# Patient Record
Sex: Female | Born: 1960 | Race: White | Hispanic: No | Smoking: Never smoker
Health system: Southern US, Community
[De-identification: ages and names within clinical notes are randomized; demographics above are authoritative.]

## PROBLEM LIST (undated history)

## (undated) DIAGNOSIS — M1990 Unspecified osteoarthritis, unspecified site: Secondary | ICD-10-CM

## (undated) DIAGNOSIS — E282 Polycystic ovarian syndrome: Secondary | ICD-10-CM

## (undated) DIAGNOSIS — J449 Chronic obstructive pulmonary disease, unspecified: Secondary | ICD-10-CM

## (undated) DIAGNOSIS — F419 Anxiety disorder, unspecified: Secondary | ICD-10-CM

## (undated) DIAGNOSIS — K219 Gastro-esophageal reflux disease without esophagitis: Secondary | ICD-10-CM

## (undated) DIAGNOSIS — I10 Essential (primary) hypertension: Secondary | ICD-10-CM

## (undated) HISTORY — PX: CERVICAL FUSION: SHX112

---

## 2000-11-05 ENCOUNTER — Emergency Department (HOSPITAL_COMMUNITY): Admission: EM | Admit: 2000-11-05 | Discharge: 2000-11-05 | Payer: Self-pay | Admitting: *Deleted

## 2001-07-08 ENCOUNTER — Emergency Department (HOSPITAL_COMMUNITY): Admission: EM | Admit: 2001-07-08 | Discharge: 2001-07-08 | Payer: Self-pay | Admitting: Emergency Medicine

## 2001-10-07 ENCOUNTER — Encounter: Payer: Self-pay | Admitting: Emergency Medicine

## 2001-10-07 ENCOUNTER — Emergency Department (HOSPITAL_COMMUNITY): Admission: EM | Admit: 2001-10-07 | Discharge: 2001-10-07 | Payer: Self-pay

## 2002-02-10 ENCOUNTER — Emergency Department (HOSPITAL_COMMUNITY): Admission: EM | Admit: 2002-02-10 | Discharge: 2002-02-10 | Payer: Self-pay | Admitting: Emergency Medicine

## 2003-03-29 ENCOUNTER — Emergency Department (HOSPITAL_COMMUNITY): Admission: EM | Admit: 2003-03-29 | Discharge: 2003-03-29 | Payer: Self-pay | Admitting: Emergency Medicine

## 2003-04-23 ENCOUNTER — Emergency Department (HOSPITAL_COMMUNITY): Admission: EM | Admit: 2003-04-23 | Discharge: 2003-04-23 | Payer: Self-pay | Admitting: Emergency Medicine

## 2003-05-02 ENCOUNTER — Inpatient Hospital Stay (HOSPITAL_COMMUNITY): Admission: EM | Admit: 2003-05-02 | Discharge: 2003-05-04 | Payer: Self-pay | Admitting: Internal Medicine

## 2003-12-26 ENCOUNTER — Emergency Department (HOSPITAL_COMMUNITY): Admission: EM | Admit: 2003-12-26 | Discharge: 2003-12-26 | Payer: Self-pay | Admitting: Emergency Medicine

## 2003-12-29 ENCOUNTER — Emergency Department (HOSPITAL_COMMUNITY): Admission: EM | Admit: 2003-12-29 | Discharge: 2003-12-29 | Payer: Self-pay | Admitting: Emergency Medicine

## 2004-02-06 ENCOUNTER — Emergency Department (HOSPITAL_COMMUNITY): Admission: EM | Admit: 2004-02-06 | Discharge: 2004-02-06 | Payer: Self-pay | Admitting: Emergency Medicine

## 2004-03-22 ENCOUNTER — Emergency Department (HOSPITAL_COMMUNITY): Admission: EM | Admit: 2004-03-22 | Discharge: 2004-03-22 | Payer: Self-pay | Admitting: Emergency Medicine

## 2004-06-12 ENCOUNTER — Emergency Department (HOSPITAL_COMMUNITY): Admission: EM | Admit: 2004-06-12 | Discharge: 2004-06-12 | Payer: Self-pay | Admitting: Emergency Medicine

## 2006-01-13 ENCOUNTER — Emergency Department (HOSPITAL_COMMUNITY): Admission: EM | Admit: 2006-01-13 | Discharge: 2006-01-14 | Payer: Self-pay | Admitting: Emergency Medicine

## 2006-06-21 ENCOUNTER — Emergency Department (HOSPITAL_COMMUNITY): Admission: EM | Admit: 2006-06-21 | Discharge: 2006-06-21 | Payer: Self-pay | Admitting: Emergency Medicine

## 2006-08-09 ENCOUNTER — Emergency Department (HOSPITAL_COMMUNITY): Admission: EM | Admit: 2006-08-09 | Discharge: 2006-08-09 | Payer: Self-pay | Admitting: Emergency Medicine

## 2006-09-07 ENCOUNTER — Emergency Department (HOSPITAL_COMMUNITY): Admission: EM | Admit: 2006-09-07 | Discharge: 2006-09-07 | Payer: Self-pay | Admitting: Emergency Medicine

## 2006-11-26 ENCOUNTER — Emergency Department (HOSPITAL_COMMUNITY): Admission: EM | Admit: 2006-11-26 | Discharge: 2006-11-26 | Payer: Self-pay | Admitting: Emergency Medicine

## 2007-02-27 ENCOUNTER — Emergency Department (HOSPITAL_COMMUNITY): Admission: EM | Admit: 2007-02-27 | Discharge: 2007-02-27 | Payer: Self-pay | Admitting: Emergency Medicine

## 2007-04-14 ENCOUNTER — Emergency Department (HOSPITAL_COMMUNITY): Admission: EM | Admit: 2007-04-14 | Discharge: 2007-04-14 | Payer: Self-pay | Admitting: Emergency Medicine

## 2007-04-21 ENCOUNTER — Emergency Department (HOSPITAL_COMMUNITY): Admission: EM | Admit: 2007-04-21 | Discharge: 2007-04-21 | Payer: Self-pay | Admitting: Emergency Medicine

## 2007-07-22 ENCOUNTER — Emergency Department (HOSPITAL_COMMUNITY): Admission: EM | Admit: 2007-07-22 | Discharge: 2007-07-22 | Payer: Self-pay | Admitting: Emergency Medicine

## 2007-09-10 ENCOUNTER — Emergency Department (HOSPITAL_COMMUNITY): Admission: EM | Admit: 2007-09-10 | Discharge: 2007-09-10 | Payer: Self-pay | Admitting: Emergency Medicine

## 2008-04-14 ENCOUNTER — Emergency Department (HOSPITAL_COMMUNITY): Admission: EM | Admit: 2008-04-14 | Discharge: 2008-04-14 | Payer: Self-pay | Admitting: Emergency Medicine

## 2008-05-13 ENCOUNTER — Emergency Department (HOSPITAL_COMMUNITY): Admission: EM | Admit: 2008-05-13 | Discharge: 2008-05-14 | Payer: Self-pay | Admitting: Emergency Medicine

## 2008-05-22 ENCOUNTER — Emergency Department (HOSPITAL_COMMUNITY): Admission: EM | Admit: 2008-05-22 | Discharge: 2008-05-22 | Payer: Self-pay | Admitting: Emergency Medicine

## 2008-09-05 ENCOUNTER — Emergency Department (HOSPITAL_COMMUNITY): Admission: EM | Admit: 2008-09-05 | Discharge: 2008-09-05 | Payer: Self-pay | Admitting: Emergency Medicine

## 2008-09-12 ENCOUNTER — Emergency Department (HOSPITAL_COMMUNITY): Admission: EM | Admit: 2008-09-12 | Discharge: 2008-09-12 | Payer: Self-pay | Admitting: Emergency Medicine

## 2009-02-19 ENCOUNTER — Emergency Department (HOSPITAL_COMMUNITY): Admission: EM | Admit: 2009-02-19 | Discharge: 2009-02-19 | Payer: Self-pay | Admitting: Emergency Medicine

## 2009-06-22 HISTORY — PX: BACK SURGERY: SHX140

## 2010-02-16 ENCOUNTER — Emergency Department (HOSPITAL_COMMUNITY): Admission: EM | Admit: 2010-02-16 | Discharge: 2010-02-16 | Payer: Self-pay | Admitting: Emergency Medicine

## 2010-02-18 ENCOUNTER — Emergency Department (HOSPITAL_COMMUNITY): Admission: EM | Admit: 2010-02-18 | Discharge: 2010-02-18 | Payer: Self-pay | Admitting: Emergency Medicine

## 2010-05-02 ENCOUNTER — Emergency Department (HOSPITAL_COMMUNITY): Admission: EM | Admit: 2010-05-02 | Discharge: 2010-05-02 | Payer: Self-pay | Admitting: Emergency Medicine

## 2010-07-12 ENCOUNTER — Emergency Department (HOSPITAL_COMMUNITY)
Admission: EM | Admit: 2010-07-12 | Discharge: 2010-07-12 | Payer: Self-pay | Source: Home / Self Care | Admitting: Emergency Medicine

## 2010-07-22 ENCOUNTER — Emergency Department (HOSPITAL_COMMUNITY)
Admission: EM | Admit: 2010-07-22 | Discharge: 2010-07-22 | Payer: Self-pay | Source: Home / Self Care | Admitting: Emergency Medicine

## 2010-07-22 LAB — CBC
HCT: 33.7 % — ABNORMAL LOW (ref 36.0–46.0)
Hemoglobin: 11.4 g/dL — ABNORMAL LOW (ref 12.0–15.0)
MCH: 26.6 pg (ref 26.0–34.0)
MCHC: 33.8 g/dL (ref 30.0–36.0)
MCV: 78.6 fL (ref 78.0–100.0)
Platelets: 373 10*3/uL (ref 150–400)
RBC: 4.29 MIL/uL (ref 3.87–5.11)
RDW: 12.8 % (ref 11.5–15.5)
WBC: 6.5 10*3/uL (ref 4.0–10.5)

## 2010-07-22 LAB — DIFFERENTIAL
Basophils Absolute: 0 10*3/uL (ref 0.0–0.1)
Basophils Relative: 1 % (ref 0–1)
Eosinophils Absolute: 0.1 10*3/uL (ref 0.0–0.7)
Eosinophils Relative: 1 % (ref 0–5)
Lymphocytes Relative: 34 % (ref 12–46)
Lymphs Abs: 2.2 10*3/uL (ref 0.7–4.0)
Monocytes Absolute: 0.3 10*3/uL (ref 0.1–1.0)
Monocytes Relative: 5 % (ref 3–12)
Neutro Abs: 3.9 10*3/uL (ref 1.7–7.7)
Neutrophils Relative %: 60 % (ref 43–77)

## 2010-07-22 LAB — BASIC METABOLIC PANEL
BUN: 8 mg/dL (ref 6–23)
CO2: 30 mEq/L (ref 19–32)
Calcium: 9 mg/dL (ref 8.4–10.5)
Chloride: 102 mEq/L (ref 96–112)
Creatinine, Ser: 0.74 mg/dL (ref 0.4–1.2)
GFR calc Af Amer: >60 mL/min (ref >60)
GFR calc non Af Amer: >60 mL/min (ref >60)
Glucose, Bld: 111 mg/dL — ABNORMAL HIGH (ref 70–99)
Potassium: 3.5 mEq/L (ref 3.5–5.1)
Sodium: 138 mEq/L (ref 135–145)

## 2010-09-04 LAB — DIFFERENTIAL
Basophils Absolute: 0 10*3/uL (ref 0.0–0.1)
Basophils Relative: 0 % (ref 0–1)
Eosinophils Absolute: 0.1 10*3/uL (ref 0.0–0.7)
Eosinophils Relative: 1 % (ref 0–5)
Lymphocytes Relative: 18 % (ref 12–46)
Lymphs Abs: 1.5 10*3/uL (ref 0.7–4.0)
Monocytes Absolute: 0.5 10*3/uL (ref 0.1–1.0)
Monocytes Relative: 6 % (ref 3–12)
Neutro Abs: 6.3 10*3/uL (ref 1.7–7.7)
Neutrophils Relative %: 75 % (ref 43–77)

## 2010-09-04 LAB — LIPASE, BLOOD: Lipase: 32 U/L (ref 11–59)

## 2010-09-04 LAB — COMPREHENSIVE METABOLIC PANEL
ALT: 18 U/L (ref 0–35)
AST: 16 U/L (ref 0–37)
Albumin: 3.9 g/dL (ref 3.5–5.2)
Alkaline Phosphatase: 44 U/L (ref 39–117)
BUN: 11 mg/dL (ref 6–23)
CO2: 27 mEq/L (ref 19–32)
Calcium: 8.5 mg/dL (ref 8.4–10.5)
Chloride: 102 mEq/L (ref 96–112)
Creatinine, Ser: 0.85 mg/dL (ref 0.4–1.2)
GFR calc Af Amer: >60 mL/min (ref >60)
GFR calc non Af Amer: >60 mL/min (ref >60)
Glucose, Bld: 119 mg/dL — ABNORMAL HIGH (ref 70–99)
Potassium: 3.5 mEq/L (ref 3.5–5.1)
Sodium: 135 mEq/L (ref 135–145)
Total Bilirubin: 0.3 mg/dL (ref 0.3–1.2)
Total Protein: 6.9 g/dL (ref 6.0–8.3)

## 2010-09-04 LAB — URINALYSIS, ROUTINE W REFLEX MICROSCOPIC
Bilirubin Urine: NEGATIVE
Glucose, UA: NEGATIVE mg/dL
Leukocytes, UA: NEGATIVE
Nitrite: NEGATIVE
Protein, ur: NEGATIVE mg/dL
Specific Gravity, Urine: 1.02 (ref 1.005–1.030)
Urobilinogen, UA: 0.2 mg/dL (ref 0.0–1.0)
pH: 6 (ref 5.0–8.0)

## 2010-09-04 LAB — CBC
HCT: 34.9 % — ABNORMAL LOW (ref 36.0–46.0)
Hemoglobin: 11.2 g/dL — ABNORMAL LOW (ref 12.0–15.0)
MCH: 26.6 pg (ref 26.0–34.0)
MCHC: 32.1 g/dL (ref 30.0–36.0)
MCV: 82.9 fL (ref 78.0–100.0)
Platelets: 317 10*3/uL (ref 150–400)
RBC: 4.21 MIL/uL (ref 3.87–5.11)
RDW: 13.5 % (ref 11.5–15.5)
WBC: 8.5 10*3/uL (ref 4.0–10.5)

## 2010-09-04 LAB — POCT I-STAT, CHEM 8
BUN: 9 mg/dL (ref 6–23)
Calcium, Ion: 1.25 mmol/L (ref 1.12–1.32)
Chloride: 102 mEq/L (ref 96–112)
Creatinine, Ser: 0.6 mg/dL (ref 0.4–1.2)
Glucose, Bld: 112 mg/dL — ABNORMAL HIGH (ref 70–99)
HCT: 36 % (ref 36.0–46.0)
Hemoglobin: 12.2 g/dL (ref 12.0–15.0)
Potassium: 3.4 mEq/L — ABNORMAL LOW (ref 3.5–5.1)
Sodium: 139 mEq/L (ref 135–145)
TCO2: 27 mmol/L (ref 0–100)

## 2010-09-04 LAB — URINE MICROSCOPIC-ADD ON

## 2010-09-04 LAB — POCT PREGNANCY, URINE: Preg Test, Ur: NEGATIVE

## 2010-09-04 LAB — GLUCOSE, CAPILLARY: Glucose-Capillary: 121 mg/dL — ABNORMAL HIGH (ref 70–99)

## 2010-09-27 LAB — BASIC METABOLIC PANEL
BUN: 6 mg/dL (ref 6–23)
CO2: 30 mEq/L (ref 19–32)
Calcium: 9.3 mg/dL (ref 8.4–10.5)
Chloride: 98 mEq/L (ref 96–112)
Creatinine, Ser: 0.78 mg/dL (ref 0.4–1.2)
GFR calc Af Amer: >60 mL/min (ref >60)
GFR calc non Af Amer: >60 mL/min (ref >60)
Glucose, Bld: 101 mg/dL — ABNORMAL HIGH (ref 70–99)
Potassium: 3.5 mEq/L (ref 3.5–5.1)
Sodium: 135 mEq/L (ref 135–145)

## 2010-09-27 LAB — CBC
HCT: 34.8 % — ABNORMAL LOW (ref 36.0–46.0)
Hemoglobin: 11.9 g/dL — ABNORMAL LOW (ref 12.0–15.0)
MCHC: 34.1 g/dL (ref 30.0–36.0)
MCV: 81.1 fL (ref 78.0–100.0)
Platelets: 348 10*3/uL (ref 150–400)
RBC: 4.3 MIL/uL (ref 3.87–5.11)
RDW: 14.3 % (ref 11.5–15.5)
WBC: 8.2 10*3/uL (ref 4.0–10.5)

## 2010-09-27 LAB — DIFFERENTIAL
Basophils Absolute: 0 10*3/uL (ref 0.0–0.1)
Basophils Relative: 0 % (ref 0–1)
Eosinophils Absolute: 0.1 10*3/uL (ref 0.0–0.7)
Eosinophils Relative: 1 % (ref 0–5)
Lymphocytes Relative: 20 % (ref 12–46)
Lymphs Abs: 1.6 10*3/uL (ref 0.7–4.0)
Monocytes Absolute: 0.5 10*3/uL (ref 0.1–1.0)
Monocytes Relative: 6 % (ref 3–12)
Neutro Abs: 6 10*3/uL (ref 1.7–7.7)
Neutrophils Relative %: 73 % (ref 43–77)

## 2010-10-02 LAB — CBC
Platelets: 337 10*3/uL (ref 150–400)
RBC: 4.54 MIL/uL (ref 3.87–5.11)
WBC: 9 10*3/uL (ref 4.0–10.5)

## 2010-10-02 LAB — HEPATIC FUNCTION PANEL
ALT: 39 U/L — ABNORMAL HIGH (ref 0–35)
AST: 28 U/L (ref 0–37)
Albumin: 4 g/dL (ref 3.5–5.2)
Alkaline Phosphatase: 58 U/L (ref 39–117)
Total Protein: 7.9 g/dL (ref 6.0–8.3)

## 2010-10-02 LAB — DIFFERENTIAL
Eosinophils Absolute: 0.1 10*3/uL (ref 0.0–0.7)
Lymphocytes Relative: 22 % (ref 12–46)
Lymphs Abs: 2 10*3/uL (ref 0.7–4.0)
Neutro Abs: 6.3 10*3/uL (ref 1.7–7.7)
Neutrophils Relative %: 70 % (ref 43–77)

## 2010-10-02 LAB — BASIC METABOLIC PANEL
BUN: 11 mg/dL (ref 6–23)
BUN: 9 mg/dL (ref 6–23)
CO2: 29 mEq/L (ref 19–32)
Calcium: 8.9 mg/dL (ref 8.4–10.5)
Chloride: 98 mEq/L (ref 96–112)
Creatinine, Ser: 0.72 mg/dL (ref 0.4–1.2)
Creatinine, Ser: 0.77 mg/dL (ref 0.4–1.2)
GFR calc Af Amer: >60 mL/min (ref >60)
GFR calc Af Amer: >60 mL/min (ref >60)
GFR calc non Af Amer: >60 mL/min (ref >60)
Potassium: 3.5 mEq/L (ref 3.5–5.1)

## 2010-10-02 LAB — URINALYSIS, ROUTINE W REFLEX MICROSCOPIC
Glucose, UA: NEGATIVE mg/dL
Leukocytes, UA: NEGATIVE
Nitrite: NEGATIVE
Specific Gravity, Urine: <1.005 — ABNORMAL LOW (ref 1.005–1.030)
pH: 5.5 (ref 5.0–8.0)

## 2010-10-02 LAB — LIPASE, BLOOD: Lipase: 28 U/L (ref 11–59)

## 2010-10-02 LAB — URINE MICROSCOPIC-ADD ON

## 2010-10-02 LAB — GLUCOSE, CAPILLARY: Glucose-Capillary: 124 mg/dL — ABNORMAL HIGH (ref 70–99)

## 2010-11-07 NOTE — Discharge Summary (Signed)
NAME:  [name], [name]                        ACCOUNT NO.:  [id number]   MEDICAL RECORD NO.:  [id number]                   PATIENT TYPE:  INP   LOCATION:  A302                                 FACILITY:  [facility]   PHYSICIAN:  Vania Rea, M.D.              DATE OF BIRTH:  04-21-1961   DATE OF ADMISSION:  05/02/2003  DATE OF DISCHARGE:                                 DISCHARGE SUMMARY   PRIMARY MEDICAL CARE:  [facility] in [place].   DISCHARGE DIAGNOSES:  1. Lobar pneumonia, resolved.  2. Hypertension, controlled.  3. History of polycystic ovarian disease, stable.  4. Asthma, controlled.  5. Sinusitis.   DISCHARGE CONDITION:  Stable.   DISPOSITION:  To home.   DISCHARGE MEDICATIONS:  1. Atrovent and albuterol inhaler q.4h. when necessary.  2. Levaquin 500 mg daily for 14 days.  3. Robitussin 10 mL three times per day.  4. Flonase nasal spray one spray in both nostrils at bedtime.  5. Sudafed 30 mg three times daily for five days.  6. Lisinopril 20 mg daily.   The patient has been advised to discontinue hydrochlorothiazide.   HOSPITAL COURSE:  Please refer to History and Physical of May 02, 2003.  The patient complained of a dry cough for four days, was seen at an outside  hospital, received a Z-Pak and sent home but continued to get worse, was  phoned and told she had a pneumonia, eventually came to [place].  When we x-rayed  her she had right lower lobe pneumonia and was looking ill.  We admitted her  for observation and to start IV antibiotics.  However, the following morning  her ABGs suggested a PO2 of 68 which we thought she was inappropriately  hypoxic.  She was still having a lot of crepitations on the left side of  chest.  We added chest physiotherapy to her regimen as well as adding  Zithromax to her Levaquin.  Today she is looking much better.  When we  examine her chest there is a minimal amount of crepitation in the right base  posteriorly.   However, she is now complaining of a postnasal drip which is  causing her to cough.  We are discharging her with a 14-day course of  Levaquin which should completely resolve her pneumonia, and we are also  giving her Flonase and Sudafed for the sinusitis despite her history of high  blood pressure.  Because we do not want her to become dehydrated with a  pneumonia we are switching the hydrochlorothiazide to lisinopril.  Lisinopril will also assist in the management of her metabolic syndrome  which she probably has; this can be further investigated as an outpatient.  Hydrochlorothiazide also will tend to increase her blood sugar in this  patient who is prone to diabetes.   FOLLOW-UP:  At the free clinic in [place].   SPECIAL INSTRUCTIONS:  None at this time.  ___________________________________________                                         Vania Rea, M.D.   LC/MEDQ  D:  05/04/2003  T:  05/04/2003  Job:  [id number]

## 2010-11-07 NOTE — H&P (Signed)
NAME:  ROSELINE, [name]                        ACCOUNT NO.:  [id number]   MEDICAL RECORD NO.:  [id number]                   PATIENT TYPE:  INP   LOCATION:  A302                                 FACILITY:  [facility]   PHYSICIAN:  Vania Rea, M.D.              DATE OF BIRTH:  14-Dec-1960   DATE OF ADMISSION:  05/02/2003  DATE OF DISCHARGE:                                HISTORY & PHYSICAL   CHIEF COMPLAINT:  Dry cough for four days.   HISTORY OF PRESENT ILLNESS:  Patient attends a public clinic for healthcare.  [name] with a history of asthma, polycystic disease and  hypertension who was in fairly good health until four days ago when she  developed severe cough and shortness of breath.  She called emergency  medical services who took her to [facility] where she was diagnosed  with bronchitis and sent home with a Z-Pak.  The following day, later they  phoned her to say that the chest x-ray, she had done, showed that she had a  pneumonia; however, there was no change in her medication.  She became  progressively worse and today she came to our emergency room from where she  was admitted.  She denies fever but does have episodic sweating.  Her cough  is only occasionally productive.  She does have some dyspnea on exertion.  She has been deliberately loosing weight because of her polycystic ovary  disease.  She has been told it will help her blood pressure.  So far this  has not been the case.   PAST MEDICAL HISTORY:  Significant for:  1. Asthma.  2. Polycystic ovarian disease.  3. Hypertension.   ALLERGIES:  PENICILLIN.   MEDICATIONS:  1. Hydrochlorothiazide 25 mg daily.  2. Albuterol inhaler.  3. Atrovent inhaler.  4. Tylenol when necessary.   SOCIAL HISTORY:  No tobacco, alcohol or drugs.  She has one daughter.   FAMILY HISTORY:  Significant for her mother has a stroke, heart failure and  diabetes.  She has four brothers, a 40 year old brother has  hypertension,  the 79 year old brother had an MI.   REVIEW OF SYSTEMS:  Nil further contributory.   PHYSICAL EXAMINATION:  GENERAL:  This is an obese, young lady lying in bed  in moderately severe respiratory distress.  VITAL SIGNS:  Her temperature is 98.2.  Her pulse is 100.  Her respirations  are 24.  Her blood pressure is 139/83.  She is saturating at 98% on room  air.  HEENT:  Pupils are equal and reactive.  She is pink.  She is anicteric.  She  is dry.  CHEST:  She has coarse crypts in the lower two thirds of her right lung  posteriorly.  There is no dullness.  CARDIOVASCULAR:  Regular rhythm.  No  murmurs.  ABDOMEN:  Soft and nontender.  EXTREMITIES:  Her dorsalis pedis pulse is  2+ bilaterally.  There is no  edema.  CNS:  She is alert and oriented x3.  There is no focal deficit.   LABS:  Her white count is 3.5 with 51% neutrophils, 31% lymphocytes, and 5%  eosinophils.  This is a normal differential.  Her platelet count is 341.  Her hemoglobin is 13.1 and hematocrit is 38.8.  Her sodium is 134, potassium  is 3.2.  Her chloride is 101.  Her CO2 is 27.  Her glucose is 116.  Her BUN  is 5.  Her creatinine is 0.7, and her calcium is 8.6.   Chest x-ray shows perihilar bronchitic changes and a right basal infiltrate.   ASSESSMENT:  Atypical pneumonia in an asthmatic patient.  The patient was  started on Levaquin in the emergency room.  Since the patient has had her  white count suppressed on Zithromax, she is afebrile, we will also give her  a dose of Zithromax intravenously and plan to discharge her on Levaquin in  the morning.     ___________________________________________                                         Vania Rea, M.D.   LC/MEDQ  D:  05/02/2003  T:  05/02/2003  Job:  [id number]

## 2011-03-16 LAB — BASIC METABOLIC PANEL
Chloride: 98
GFR calc non Af Amer: >60
Glucose, Bld: 130 — ABNORMAL HIGH
Potassium: 3.4 — ABNORMAL LOW
Sodium: 136

## 2011-04-01 LAB — BASIC METABOLIC PANEL
BUN: 13
BUN: 17
CO2: 32
Chloride: 100
Chloride: 95 — ABNORMAL LOW
Creatinine, Ser: 0.93
GFR calc Af Amer: >60
Potassium: 3.7

## 2011-04-01 LAB — DIFFERENTIAL
Basophils Relative: 1
Eosinophils Absolute: 0.1
Eosinophils Absolute: 0.1
Eosinophils Relative: 1
Lymphs Abs: 2.2
Monocytes Relative: 7
Monocytes Relative: 7
Neutrophils Relative %: 63

## 2011-04-01 LAB — CBC
HCT: 39.3
MCHC: 33.9
MCV: 77.7 — ABNORMAL LOW
MCV: 78
Platelets: 375
RBC: 4.73
RBC: 5.04
WBC: 8.7

## 2011-04-01 LAB — URINALYSIS, ROUTINE W REFLEX MICROSCOPIC
Bilirubin Urine: NEGATIVE
Bilirubin Urine: NEGATIVE
Glucose, UA: NEGATIVE
Glucose, UA: NEGATIVE
Hgb urine dipstick: NEGATIVE
Ketones, ur: NEGATIVE
Ketones, ur: NEGATIVE
Leukocytes, UA: NEGATIVE
Protein, ur: NEGATIVE
Protein, ur: NEGATIVE

## 2011-09-12 ENCOUNTER — Encounter (HOSPITAL_COMMUNITY): Payer: Self-pay | Admitting: *Deleted

## 2011-09-12 ENCOUNTER — Emergency Department (HOSPITAL_COMMUNITY)
Admission: EM | Admit: 2011-09-12 | Discharge: 2011-09-12 | Disposition: A | Discharge status: Home / Self Care | Payer: Self-pay | Attending: Emergency Medicine | Admitting: Emergency Medicine

## 2011-09-12 ENCOUNTER — Emergency Department (HOSPITAL_COMMUNITY): Payer: Self-pay

## 2011-09-12 DIAGNOSIS — R059 Cough, unspecified: Secondary | ICD-10-CM | POA: Insufficient documentation

## 2011-09-12 DIAGNOSIS — IMO0001 Reserved for inherently not codable concepts without codable children: Secondary | ICD-10-CM | POA: Insufficient documentation

## 2011-09-12 DIAGNOSIS — R05 Cough: Secondary | ICD-10-CM | POA: Insufficient documentation

## 2011-09-12 DIAGNOSIS — R079 Chest pain, unspecified: Secondary | ICD-10-CM | POA: Insufficient documentation

## 2011-09-12 DIAGNOSIS — H9209 Otalgia, unspecified ear: Secondary | ICD-10-CM | POA: Insufficient documentation

## 2011-09-12 DIAGNOSIS — Z79899 Other long term (current) drug therapy: Secondary | ICD-10-CM | POA: Insufficient documentation

## 2011-09-12 DIAGNOSIS — R0602 Shortness of breath: Secondary | ICD-10-CM | POA: Insufficient documentation

## 2011-09-12 DIAGNOSIS — R5381 Other malaise: Secondary | ICD-10-CM | POA: Insufficient documentation

## 2011-09-12 DIAGNOSIS — R599 Enlarged lymph nodes, unspecified: Secondary | ICD-10-CM | POA: Insufficient documentation

## 2011-09-12 DIAGNOSIS — I10 Essential (primary) hypertension: Secondary | ICD-10-CM | POA: Insufficient documentation

## 2011-09-12 DIAGNOSIS — J45901 Unspecified asthma with (acute) exacerbation: Secondary | ICD-10-CM | POA: Insufficient documentation

## 2011-09-12 DIAGNOSIS — J209 Acute bronchitis, unspecified: Secondary | ICD-10-CM | POA: Insufficient documentation

## 2011-09-12 DIAGNOSIS — E119 Type 2 diabetes mellitus without complications: Secondary | ICD-10-CM | POA: Insufficient documentation

## 2011-09-12 HISTORY — DX: Essential (primary) hypertension: I10

## 2011-09-12 MED ORDER — PREDNISONE 20 MG PO TABS: 40.0000 mg | ORAL_TABLET | Freq: Every day | ORAL | Status: AC

## 2011-09-12 MED ORDER — ALBUTEROL SULFATE HFA 108 (90 BASE) MCG/ACT IN AERS: 2.0000 | INHALATION_SPRAY | RESPIRATORY_TRACT | Status: AC | PRN

## 2011-09-12 MED ORDER — IPRATROPIUM BROMIDE 0.02 % IN SOLN: 0.5000 mg | Freq: Once | RESPIRATORY_TRACT | Status: DC

## 2011-09-12 MED ORDER — HYDROCODONE-ACETAMINOPHEN 5-325 MG PO TABS: 1.0000 | ORAL_TABLET | ORAL | Status: AC | PRN

## 2011-09-12 MED ORDER — PREDNISONE 20 MG PO TABS
60.0000 mg | ORAL_TABLET | Freq: Once | ORAL | Status: AC
  Administered 2011-09-12: 60 mg via ORAL
  Filled 2011-09-12: qty 3

## 2011-09-12 MED ORDER — ALBUTEROL SULFATE (5 MG/ML) 0.5% IN NEBU: 2.5000 mg | INHALATION_SOLUTION | Freq: Once | RESPIRATORY_TRACT | Status: DC

## 2011-09-12 MED ORDER — HYDROCODONE-ACETAMINOPHEN 5-325 MG PO TABS
2.0000 | ORAL_TABLET | Freq: Once | ORAL | Status: AC
  Administered 2011-09-12: 2 via ORAL
  Filled 2011-09-12: qty 2

## 2011-09-12 MED ORDER — ALBUTEROL SULFATE HFA 108 (90 BASE) MCG/ACT IN AERS
2.0000 | INHALATION_SPRAY | RESPIRATORY_TRACT | Status: DC | PRN
  Administered 2011-09-12: 2 via RESPIRATORY_TRACT
  Filled 2011-09-12: qty 6.7

## 2011-09-12 NOTE — ED Notes (Signed)
Pt reporting cough and chest congestion for about 3 weeks.  Denies cough being productive.

## 2011-09-12 NOTE — Discharge Instructions (Signed)
Acute Bronchitis You have acute bronchitis. This means you have a chest cold. The airways in your lungs are red and sore (inflamed). Acute means it is sudden onset.  CAUSES Bronchitis is most often caused by the same virus that causes a cold. SYMPTOMS   Body aches.   Chest congestion.   Chills.   Cough.   Fever.   Shortness of breath.   Sore throat.  TREATMENT  Acute bronchitis is usually treated with rest, fluids, and medicines for relief of fever or cough. Most symptoms should go away after a few days or a week. Increased fluids may help thin your secretions and will prevent dehydration. Your caregiver may give you an inhaler to improve your symptoms. The inhaler reduces shortness of breath and helps control cough. You can take over-the-counter pain relievers or cough medicine to decrease coughing, pain, or fever. A cool-air vaporizer may help thin bronchial secretions and make it easier to clear your chest. Antibiotics are usually not needed but can be prescribed if you smoke, are seriously ill, have chronic lung problems, are elderly, or you are at higher risk for developing complications.Allergies and asthma can make bronchitis worse. Repeated episodes of bronchitis may cause longstanding lung problems. Avoid smoking and secondhand smoke.Exposure to cigarette smoke or irritating chemicals will make bronchitis worse. If you are a cigarette smoker, consider using nicotine gum or skin patches to help control withdrawal symptoms. Quitting smoking will help your lungs heal faster. Recovery from bronchitis is often slow, but you should start feeling better after 2 to 3 days. Cough from bronchitis frequently lasts for 3 to 4 weeks. To prevent another bout of acute bronchitis:  Quit smoking.   Wash your hands frequently to get rid of viruses or use a hand sanitizer.   Avoid other people with cold or virus symptoms.   Try not to touch your hands to your mouth, nose, or eyes.  SEEK  IMMEDIATE MEDICAL CARE IF:  You develop increased fever, chills, or chest pain.   You have severe shortness of breath or bloody sputum.   You develop dehydration, fainting, repeated vomiting, or a severe headache.   You have no improvement after 1 week of treatment or you get worse.  MAKE SURE YOU:   Understand these instructions.   Will watch your condition.   Will get help right away if you are not doing well or get worse.  Document Released: 07/16/2004 Document Revised: 05/28/2011 Document Reviewed: 10/01/2010 [facility] Patient Information 2012 [place], [place].Antibiotic Nonuse  Your caregiver felt that the infection or problem was not one that would be helped with an antibiotic. Infections may be caused by viruses or bacteria. Only a caregiver can tell which one of these is the likely cause of an illness. A cold is the most common cause of infection in both adults and children. A cold is a virus. Antibiotic treatment will have no effect on a viral infection. Viruses can lead to many lost days of work caring for sick children and many missed days of school. Children may catch as many as 10 "colds" or "flus" per year during which they can be tearful, cranky, and uncomfortable. The goal of treating a virus is aimed at keeping the ill person comfortable. Antibiotics are medications used to help the body fight bacterial infections. There are relatively few types of bacteria that cause infections but there are hundreds of viruses. While both viruses and bacteria cause infection they are very different types of germs. A viral infection  will typically go away by itself within 7 to 10 days. Bacterial infections may spread or get worse without antibiotic treatment. Examples of bacterial infections are:  Sore throats (like strep throat or tonsillitis).   Infection in the lung (pneumonia).   Ear and skin infections.  Examples of viral infections are:  Colds or flus.   Most coughs and  bronchitis.   Sore throats not caused by Strep.   Runny noses.  It is often best not to take an antibiotic when a viral infection is the cause of the problem. Antibiotics can kill off the helpful bacteria that we have inside our body and allow harmful bacteria to start growing. Antibiotics can cause side effects such as allergies, nausea, and diarrhea without helping to improve the symptoms of the viral infection. Additionally, repeated uses of antibiotics can cause bacteria inside of our body to become resistant. That resistance can be passed onto harmful bacterial. The next time you have an infection it may be harder to treat if antibiotics are used when they are not needed. Not treating with antibiotics allows our own immune system to develop and take care of infections more efficiently. Also, antibiotics will work better for [place] when they are prescribed for bacterial infections. Treatments for a child that is ill may include:  Give extra fluids throughout the day to stay hydrated.   Get plenty of rest.   Only give your child over-the-counter or prescription medicines for pain, discomfort, or fever as directed by your caregiver.   The use of a cool mist humidifier may help stuffy noses.   Cold medications if suggested by your caregiver.  Your caregiver may decide to start you on an antibiotic if:  The problem you were seen for today continues for a longer length of time than expected.   You develop a secondary bacterial infection.  SEEK MEDICAL CARE IF:  Fever lasts longer than 5 days.   Symptoms continue to get worse after 5 to 7 days or become severe.   Difficulty in breathing develops.   Signs of dehydration develop (poor drinking, rare urinating, dark colored urine).   Changes in behavior or worsening tiredness (listlessness or lethargy).  Document Released: 08/17/2001 Document Revised: 05/28/2011 Document Reviewed: 02/13/2009 [facility] Patient Information 2012 [place],  [place].

## 2011-09-12 NOTE — ED Provider Notes (Signed)
History     CSN: [id number]  Arrival date & time 09/12/11  7829   First MD Initiated Contact with Patient 09/12/11 [phone]      Chief Complaint  Patient presents with  . Cough  . Shortness of Breath    (Consider location/radiation/quality/duration/timing/severity/associated sxs/prior treatment) Patient is a 51 y.o. female presenting with cough. The history is provided by the patient.  Cough This is a new problem. Episode onset: 3 weeks ago. The problem occurs every few minutes. The problem has been gradually worsening. The cough is non-productive. There has been no fever. Associated symptoms include chest pain, ear congestion, ear pain, myalgias, shortness of breath and wheezing. Pertinent negatives include no chills, no sweats, no headaches, no rhinorrhea, no sore throat and no eye redness. She has tried cough syrup for the symptoms. The treatment provided mild relief. Risk factors: Family members with similar symptoms. She is not a smoker. Her past medical history is significant for asthma.    Past Medical History  Diagnosis Date  . Hypertension   . Diabetes mellitus   . Asthma     Past Surgical History  Procedure Date  . Cervical fusion     History reviewed. No pertinent family history.  History  Substance Use Topics  . Smoking status: Never Smoker   . Smokeless tobacco: Not on file  . Alcohol Use: No    OB History    Grav Para Term Preterm Abortions TAB SAB Ect Mult Living                  Review of Systems  Constitutional: Positive for fatigue. Negative for fever, chills, diaphoresis, activity change, appetite change and unexpected weight change.  HENT: Positive for ear pain. Negative for hearing loss, nosebleeds, congestion, sore throat, facial swelling, rhinorrhea, sneezing, drooling, mouth sores, trouble swallowing, neck pain, neck stiffness, dental problem, voice change, postnasal drip, sinus pressure, tinnitus and ear discharge.   Eyes: Negative for  photophobia, pain, discharge, redness and itching.  Respiratory: Positive for cough, shortness of breath and wheezing. Negative for choking, chest tightness and stridor.   Cardiovascular: Positive for chest pain. Negative for palpitations and leg swelling.       Chest pain with coughing only.  Gastrointestinal: Negative for nausea, vomiting, abdominal pain, diarrhea, constipation, blood in stool, abdominal distention and anal bleeding.  Genitourinary: Negative for dysuria, urgency, frequency, hematuria, flank pain and difficulty urinating.  Musculoskeletal: Positive for myalgias. Negative for back pain, joint swelling, arthralgias and gait problem.  Skin: Negative for color change, pallor, rash and wound.  Neurological: Negative for dizziness, weakness, light-headedness and headaches.  Hematological: Positive for adenopathy. Does not bruise/bleed easily.  Psychiatric/Behavioral: Negative.     Allergies  Review of patient's allergies indicates no known allergies.  Home Medications   Current Outpatient Rx  Name Route Sig Dispense Refill  . ALBUTEROL SULFATE (2.5 MG/3ML) 0.083% IN NEBU Nebulization Take 2.5 mg by nebulization every 6 (six) hours as needed.    [name] HYDROCHLOROTHIAZIDE 12.5 MG PO TABS Oral Take 12.5 mg by mouth daily.      BP 138/94  Pulse 117  Temp 97.1 F (36.2 C)  Resp 24  Ht 5\' 3"  (1.6 m)  Wt 162 lb (73.483 kg)  BMI 28.70 kg/m2  SpO2 98%  Physical Exam  Nursing note and vitals reviewed. Constitutional: She is oriented to person, place, and time. She appears well-developed and well-nourished. No distress.  HENT:  Head: Normocephalic and atraumatic.  Right Ear: Hearing,  tympanic membrane, external ear and ear canal normal.  Left Ear: Hearing, tympanic membrane, external ear and ear canal normal.  Nose: Nose normal. No mucosal edema or rhinorrhea. Right sinus exhibits no maxillary sinus tenderness and no frontal sinus tenderness. Left sinus exhibits no maxillary  sinus tenderness and no frontal sinus tenderness.  Mouth/Throat: Uvula is midline, oropharynx is clear and moist and mucous membranes are normal. No uvula swelling. No oropharyngeal exudate, posterior oropharyngeal edema, posterior oropharyngeal erythema or tonsillar abscesses.  Eyes: Conjunctivae and EOM are normal. Pupils are equal, round, and reactive to light.  Neck: Normal range of motion. Neck supple. No JVD present. No tracheal deviation present.  Cardiovascular: Normal rate, regular rhythm, normal heart sounds and intact distal pulses.  Exam reveals no gallop and no friction rub.   No murmur heard. Pulmonary/Chest: Effort normal. No accessory muscle usage or stridor. Not tachypneic. No respiratory distress. She has no decreased breath sounds. She has wheezes in the right upper field, the right middle field, the left upper field and the left middle field. She has rhonchi in the right middle field and the left middle field. She has no rales. She exhibits no tenderness.  Abdominal: Soft. Bowel sounds are normal. She exhibits no distension. There is no tenderness. There is no rebound and no guarding.  Musculoskeletal: Normal range of motion. She exhibits no edema and no tenderness.  Lymphadenopathy:    She has no cervical adenopathy.  Neurological: She is alert and oriented to person, place, and time. She has normal reflexes. No cranial nerve deficit. She exhibits normal muscle tone. Coordination normal.  Skin: Skin is warm and dry. No rash noted. She is not diaphoretic. No erythema. No pallor.  Psychiatric: She has a normal mood and affect. Her behavior is normal. Judgment and thought content normal.    ED Course  Procedures (including critical care time)  Labs Reviewed - No data to display Dg Chest 2 View  09/12/2011  *RADIOLOGY REPORT*  Clinical Data: Cough, congestion, shortness of breath, wheezing for 1 month  CHEST - 2 VIEW  Comparison: 02/16/2010  Findings: Normal heart size,  mediastinal contours, and pulmonary vascularity. Mild chronic bronchitic changes. Lungs otherwise clear. No pleural effusion or pneumothorax. No acute bony abnormalities. Prior cervical spine fusion.  IMPRESSION: Chronic bronchitic changes.  Original Report Authenticated By: Lollie Marrow, M.D.     No diagnosis found.    MDM  The patient has no apparent pneumonia on chest x-ray, and has apparent bronchitis. She tells me she just finished an azithromycin course yesterday, so no further antibiotics will be needed. She called her primary care provider had prescribed her Tessalon Perles for cough which have not been helping. She does have a history of asthma. I will treat the patient with oral corticosteroids, inhaled beta agonists, and cough suppressant/analgesic.       Felisa Bonier, MD 09/12/11 [phone]

## 2013-10-16 ENCOUNTER — Encounter (HOSPITAL_COMMUNITY): Payer: Self-pay | Admitting: Emergency Medicine

## 2013-10-16 ENCOUNTER — Emergency Department (HOSPITAL_COMMUNITY): Payer: Disability Insurance

## 2013-10-16 ENCOUNTER — Emergency Department (HOSPITAL_COMMUNITY)
Admission: EM | Admit: 2013-10-16 | Discharge: 2013-10-16 | Disposition: A | Discharge status: Home / Self Care | Payer: Disability Insurance | Attending: Emergency Medicine | Admitting: Emergency Medicine

## 2013-10-16 DIAGNOSIS — Z79899 Other long term (current) drug therapy: Secondary | ICD-10-CM | POA: Insufficient documentation

## 2013-10-16 DIAGNOSIS — Z8659 Personal history of other mental and behavioral disorders: Secondary | ICD-10-CM | POA: Insufficient documentation

## 2013-10-16 DIAGNOSIS — E119 Type 2 diabetes mellitus without complications: Secondary | ICD-10-CM | POA: Insufficient documentation

## 2013-10-16 DIAGNOSIS — Z3202 Encounter for pregnancy test, result negative: Secondary | ICD-10-CM | POA: Insufficient documentation

## 2013-10-16 DIAGNOSIS — M129 Arthropathy, unspecified: Secondary | ICD-10-CM | POA: Insufficient documentation

## 2013-10-16 DIAGNOSIS — I10 Essential (primary) hypertension: Secondary | ICD-10-CM | POA: Insufficient documentation

## 2013-10-16 DIAGNOSIS — J45909 Unspecified asthma, uncomplicated: Secondary | ICD-10-CM | POA: Insufficient documentation

## 2013-10-16 DIAGNOSIS — Z880 Allergy status to penicillin: Secondary | ICD-10-CM | POA: Insufficient documentation

## 2013-10-16 DIAGNOSIS — E876 Hypokalemia: Secondary | ICD-10-CM

## 2013-10-16 DIAGNOSIS — R569 Unspecified convulsions: Secondary | ICD-10-CM | POA: Insufficient documentation

## 2013-10-16 DIAGNOSIS — G4700 Insomnia, unspecified: Secondary | ICD-10-CM | POA: Insufficient documentation

## 2013-10-16 HISTORY — DX: Anxiety disorder, unspecified: F41.9

## 2013-10-16 HISTORY — DX: Polycystic ovarian syndrome: E28.2

## 2013-10-16 HISTORY — DX: Unspecified osteoarthritis, unspecified site: M19.90

## 2013-10-16 LAB — I-STAT CHEM 8, ED
BUN: 6 mg/dL (ref 6–23)
CALCIUM ION: 1.09 mmol/L — AB (ref 1.12–1.23)
CHLORIDE: 99 meq/L (ref 96–112)
Creatinine, Ser: 0.9 mg/dL (ref 0.50–1.10)
GLUCOSE: 133 mg/dL — AB (ref 70–99)
HEMATOCRIT: 36 % (ref 36.0–46.0)
Hemoglobin: 12.2 g/dL (ref 12.0–15.0)
POTASSIUM: 3.2 meq/L — AB (ref 3.7–5.3)
Sodium: 139 mEq/L (ref 137–147)
TCO2: 25 mmol/L (ref 0–100)

## 2013-10-16 LAB — PREGNANCY, URINE: Preg Test, Ur: NEGATIVE

## 2013-10-16 LAB — URINALYSIS, ROUTINE W REFLEX MICROSCOPIC
BILIRUBIN URINE: NEGATIVE
GLUCOSE, UA: NEGATIVE mg/dL
KETONES UR: NEGATIVE mg/dL
LEUKOCYTES UA: NEGATIVE
NITRITE: NEGATIVE
PH: 6 (ref 5.0–8.0)
Protein, ur: NEGATIVE mg/dL
Urobilinogen, UA: 0.2 mg/dL (ref 0.0–1.0)

## 2013-10-16 LAB — CBC WITH DIFFERENTIAL/PLATELET
BASOS ABS: 0 10*3/uL (ref 0.0–0.1)
BASOS PCT: 0 % (ref 0–1)
EOS ABS: 0 10*3/uL (ref 0.0–0.7)
EOS PCT: 0 % (ref 0–5)
HCT: 35.4 % — ABNORMAL LOW (ref 36.0–46.0)
Hemoglobin: 12.4 g/dL (ref 12.0–15.0)
LYMPHS PCT: 14 % (ref 12–46)
Lymphs Abs: 1.4 10*3/uL (ref 0.7–4.0)
MCH: 28.9 pg (ref 26.0–34.0)
MCHC: 35 g/dL (ref 30.0–36.0)
MCV: 82.5 fL (ref 78.0–100.0)
MONO ABS: 0.6 10*3/uL (ref 0.1–1.0)
Monocytes Relative: 6 % (ref 3–12)
Neutro Abs: 7.8 10*3/uL — ABNORMAL HIGH (ref 1.7–7.7)
Neutrophils Relative %: 80 % — ABNORMAL HIGH (ref 43–77)
PLATELETS: 288 10*3/uL (ref 150–400)
RBC: 4.29 MIL/uL (ref 3.87–5.11)
RDW: 13.2 % (ref 11.5–15.5)
WBC: 9.9 10*3/uL (ref 4.0–10.5)

## 2013-10-16 LAB — URINE MICROSCOPIC-ADD ON

## 2013-10-16 LAB — COMPREHENSIVE METABOLIC PANEL
ALBUMIN: 3.5 g/dL (ref 3.5–5.2)
ALT: 15 U/L (ref 0–35)
AST: 12 U/L (ref 0–37)
Alkaline Phosphatase: 52 U/L (ref 39–117)
BUN: 8 mg/dL (ref 6–23)
CALCIUM: 8.5 mg/dL (ref 8.4–10.5)
CO2: 26 mEq/L (ref 19–32)
CREATININE: 0.76 mg/dL (ref 0.50–1.10)
Chloride: 101 mEq/L (ref 96–112)
GFR calc Af Amer: >90 mL/min (ref >90)
GFR calc non Af Amer: >90 mL/min (ref >90)
Glucose, Bld: 144 mg/dL — ABNORMAL HIGH (ref 70–99)
Potassium: 3.5 mEq/L — ABNORMAL LOW (ref 3.7–5.3)
SODIUM: 140 meq/L (ref 137–147)
TOTAL PROTEIN: 6.7 g/dL (ref 6.0–8.3)
Total Bilirubin: 0.2 mg/dL — ABNORMAL LOW (ref 0.3–1.2)

## 2013-10-16 LAB — CBG MONITORING, ED: GLUCOSE-CAPILLARY: 141 mg/dL — AB (ref 70–99)

## 2013-10-16 MED ORDER — SODIUM CHLORIDE 0.9 % IV BOLUS (SEPSIS)
1000.0000 mL | Freq: Once | INTRAVENOUS | Status: AC
  Administered 2013-10-16: 1000 mL via INTRAVENOUS

## 2013-10-16 MED ORDER — LORAZEPAM 2 MG/ML IJ SOLN
1.0000 mg | Freq: Once | INTRAMUSCULAR | Status: AC
  Administered 2013-10-16: 1 mg via INTRAVENOUS
  Filled 2013-10-16: qty 1

## 2013-10-16 MED ORDER — ZOLPIDEM TARTRATE 10 MG PO TABS: 10.0000 mg | ORAL_TABLET | Freq: Every evening | ORAL | Status: DC | PRN

## 2013-10-16 NOTE — Discharge Instructions (Signed)
Please call your doctor for a followup appointment within 24-48 hours. When you talk to your doctor please let them know that you were seen in the emergency department and have them acquire all of your records so that they can discuss the findings with you and formulate a treatment plan to fully care for your new and ongoing problems. ° °[facility] Doctor List ° ° ° °Edward Hawkins MD. Specialty: Pulmonary Disease Contact information: [address]  °[address]  °[place] [address]  °[phone]  ° °Margaret Simpson, MD. Specialty: Family Medicine Contact information: [address]  °[place] [address]  °[phone]  ° °Scott Luking, MD. Specialty: Family Medicine Contact information: [address]  °[address]  °[place] [address]  °[phone]  ° °Tesfaye Fanta, MD Specialty: Internal Medicine Contact information: [address]  °[place] [address]  °[phone]  ° °Zach Hall, MD. Specialty: Internal Medicine Contact information: [address]  °[place] [address]  °[phone]  ° °Angus Mcinnis, MD. Specialty: Family Medicine Contact information: [address]  °[place] [address]  °[phone]  ° °Stephen Knowlton, MD. Specialty: Family Medicine Contact information: [address]  °[address]  °[place] [address]  °[phone]  ° °Roy Fagan, MD. Specialty: Internal Medicine Contact information: [address]  °[address]  °[place] [address]  °[phone]  ° ° °

## 2013-10-16 NOTE — ED Notes (Signed)
Per EMS: pt reports she has not been able to sleep x 4 days. Started a new medication 4 weeks ago and "thinks it is causing her to twitch". Husband thought pt was having seizure-like activity. CGB 151 en route.

## 2013-10-16 NOTE — ED Notes (Signed)
Patient reports started taking Amitryptiline 4 weeks ago. States is supposed to take 25 mg q6h. Reports has been taking 25 mg q4h. Patient also complains of pain to right side of tongue. Reports she thinks she bit her tongue. Husband states patient "went out" and did not know who he was. States that she was shaking all over and legs straightened out. Husband states that patient would not talk to him for 30 minutes.

## 2013-10-16 NOTE — ED Provider Notes (Signed)
CSN: [id number]     Arrival date & time 10/16/13  0418 History   First MD Initiated Contact with Patient 10/16/13 [phone]     Chief Complaint  Patient presents with  . Shaking     (Consider location/radiation/quality/duration/timing/severity/associated sxs/prior Treatment) HPI Comments: 53 year old female with a history of hypertension, diabetes and anxiety. She reports that she has been taking Xanax for many many years, she stopped taking this approximately one month ago stating that the health Department was unable to prescribe this for her but instead changed her over to amitriptyline. She has been taking this medication for the last month but states that over the last 4 days she has been unable to sleep. She states that she gets shaky sometimes feeling like her body jerks for a couple of seconds but this evening while she was resting in her recliner unable to sleep she had a seizure which was witnessed by her husband. He reports seeing tonic-clonic-like activity with all 4 extremities becoming stiff and shaking in a rhythmic motion, her head was hyperextended and there was drool and "chewing tobacco" coming out of her mouth. She has no history of seizures, she does endorse taking increased amounts of amitriptyline up to 25 mg every 4 hours. She denies any other medication use as nonprescription including helical times and again has not had benzodiazepines in over 4 weeks. She reports having some tongue pain and has evidence of biting of the tongue, her family member and paramedics noted that the patient was confused on their arrival consistent with a post ictal state.  The history is provided by the patient, the spouse, a relative and the EMS personnel.    Past Medical History  Diagnosis Date  . Hypertension   . Diabetes mellitus   . Asthma   . Anxiety   . Arthritis   . Polycystic ovarian syndrome    Past Surgical History  Procedure Laterality Date  . Cervical fusion     No family  history on file. History  Substance Use Topics  . Smoking status: Never Smoker   . Smokeless tobacco: Not on file  . Alcohol Use: No   OB History   Grav Para Term Preterm Abortions TAB SAB Ect Mult Living                 Review of Systems  All other systems reviewed and are negative.     Allergies  Penicillins  Home Medications   Prior to Admission medications   Medication Sig Start Date End Date Taking? Authorizing Provider  lisinopril-hydrochlorothiazide (PRINZIDE,ZESTORETIC) 10-12.5 MG per tablet Take 1 tablet by mouth daily.   Yes Historical Provider, MD  lovastatin (MEVACOR) 20 MG tablet Take 40 mg by mouth at bedtime.   Yes Historical Provider, MD  oxyCODONE-acetaminophen (PERCOCET) 10-325 MG per tablet Take 1-2 tablets by mouth every 8 (eight) hours as needed for pain.   Yes Historical Provider, MD  albuterol (PROVENTIL) (2.5 MG/3ML) 0.083% nebulizer solution Take 2.5 mg by nebulization every 6 (six) hours as needed.    Historical Provider, MD  hydrochlorothiazide (HYDRODIURIL) 12.5 MG tablet Take 12.5 mg by mouth daily.    Historical Provider, MD   BP 119/67  Pulse 107  Temp(Src) 99.3 F (37.4 C) (Oral)  Resp 17  Ht 5\' 3"  (1.6 m)  Wt 160 lb (72.576 kg)  BMI 28.35 kg/m2  SpO2 95% Physical Exam  Nursing note and vitals reviewed. Constitutional: She appears well-developed and well-nourished. No distress.  HENT:  Head: Normocephalic and atraumatic.  Mouth/Throat: Oropharynx is clear and moist. No oropharyngeal exudate.  Bruising to the inferior left side of the distal tongue, no lacerations of the tongue, dentition intact  Eyes: Conjunctivae and EOM are normal. Pupils are equal, round, and reactive to light. Right eye exhibits no discharge. Left eye exhibits no discharge. No scleral icterus.  Neck: Normal range of motion. Neck supple. No JVD present. No thyromegaly present.  Cardiovascular: Normal rate, regular rhythm, normal heart sounds and intact distal pulses.   Exam reveals no gallop and no friction rub.   No murmur heard. Pulmonary/Chest: Effort normal and breath sounds normal. No respiratory distress. She has no wheezes. She has no rales.  Abdominal: Soft. Bowel sounds are normal. She exhibits no distension and no mass. There is no tenderness.  Musculoskeletal: Normal range of motion. She exhibits no edema and no tenderness.  Lymphadenopathy:    She has no cervical adenopathy.  Neurological: She is alert. Coordination normal.  Speech is clear, memory is intact except for the period of the seizure and postictal., Movements are coordinated, no limb ataxia, no pronator drift, normal finger-nose-finger, cranial nerves III through XII intact, normal strength and sensation of all 4 extremities, able to straight leg raise bilaterally, no tremor  Skin: Skin is warm and dry. No rash noted. No erythema.  Psychiatric: She has a normal mood and affect. Her behavior is normal.    ED Course  Procedures (including critical care time) Labs Review Labs Reviewed  CBC WITH DIFFERENTIAL - Abnormal; Notable for the following:    HCT 35.4 (*)    Neutrophils Relative % 80 (*)    Neutro Abs 7.8 (*)    All other components within normal limits  COMPREHENSIVE METABOLIC PANEL - Abnormal; Notable for the following:    Potassium 3.5 (*)    Glucose, Bld 144 (*)    Total Bilirubin 0.2 (*)    All other components within normal limits  URINALYSIS, ROUTINE W REFLEX MICROSCOPIC - Abnormal; Notable for the following:    Specific Gravity, Urine <1.005 (*)    Hgb urine dipstick MODERATE (*)    All other components within normal limits  URINE MICROSCOPIC-ADD ON - Abnormal; Notable for the following:    Squamous Epithelial / LPF FEW (*)    All other components within normal limits  CBG MONITORING, ED - Abnormal; Notable for the following:    Glucose-Capillary 141 (*)    All other components within normal limits  I-STAT CHEM 8, ED - Abnormal; Notable for the following:     Potassium 3.2 (*)    Glucose, Bld 133 (*)    Calcium, Ion 1.09 (*)    All other components within normal limits  PREGNANCY, URINE  CBG MONITORING, ED    Imaging Review Ct Head Wo Contrast  10/16/2013   CLINICAL DATA:  Severe headache.  EXAM: CT HEAD WITHOUT CONTRAST  TECHNIQUE: Contiguous axial images were obtained from the base of the skull through the vertex without intravenous contrast.  COMPARISON:  CT HEAD W/O CM dated 09/05/2008  FINDINGS: The ventricles and sulci are normal. No intraparenchymal hemorrhage, mass effect nor midline shift. No acute large vascular territory infarcts.  No abnormal extra-axial fluid collections. Basal cisterns are patent.  No skull fracture. The included ocular globes and orbital contents are non-suspicious. Suspected partially imaged right maxillary mucosal retention cysts without paranasal sinus air-fluid levels.  IMPRESSION: No acute intracranial process.   Electronically Signed   By: Awilda Metroourtnay  Bloomer  On: 10/16/2013 06:29     EKG Interpretation   Date/Time:  Monday October 16 2013 04:41:24 EDT Ventricular Rate:  100 PR Interval:  150 QRS Duration: 86 QT Interval:  338 QTC Calculation: 436 R Axis:   40 Text Interpretation:  Normal sinus rhythm Normal ECG When compared with  ECG of 22-Jul-2010 16:39, No significant change was found Confirmed by  Daiden Coltrane  MD, Kruz Chiu ([id number]) on 10/16/2013 5:03:44 AM      MDM   Final diagnoses:  Seizure  Insomnia  Hypokalemia    The patient has likely had a seizure, there is some question as to whether this is related to the medication or sleep deficit or some other source. She will have a CT scan of the head, lab work, EKG. EKG does not reveal widened QRS complexes or any other arrhythmias to suggest tricyclic at the depressant overdose.  [facility] the patient's use of the medication and the thought is that this could possibly be contributed to the patient's  seizure this evening, in and of itself her dosing was not outside the range of normal though she has not used this medication and the recommendation which I do agree with [facility] was that she stop this medication as it may be contributing to the seizure.  The patient has had no further seizures since arrival, laboratory workup is unremarkable, heart rate has come down below 100 and the patient has had improvement and ongoing normal mental status. She has been informed of her CT scan results, has expressed her understanding to the need to stop amitriptyline and she will be given a prescription for a small amount of Ambien to help with insomnia. She agrees to follow up with family doctor for further testing treatment and evaluation of her insomnia and anxiety.  Meds given in ED:  Medications  LORazepam (ATIVAN) injection 1 mg (1 mg Intravenous Given 10/16/13 0504)  sodium chloride 0.9 % bolus 1,000 mL (0 mLs Intravenous Stopped 10/16/13 0605)    New Prescriptions   ZOLPIDEM (AMBIEN) 10 MG TABLET    Take 1 tablet (10 mg total) by mouth at bedtime as needed for sleep.      Vida RollerBrian D Bayler Nehring, MD 10/16/13 [phone]

## 2013-10-16 NOTE — ED Notes (Signed)
Family at bedside. Patient walked to restroom with 2 person assist.

## 2013-10-30 NOTE — Care Management Note (Signed)
Pt had presented to ED 10/16/13 with problem sleeping. She was given a Rx for Ambien, but needed assistance with this. Prescription and note with phone number [phone] was left for this CM to call to assist with medication. Match program does not cover Ambien. Called to verify this with supervisor, [place]. [place], and we do not cover controlled drugs. Multiple calls and messages left at [phone] for patient to call to discuss assistance with medication, but never an answer and never received a call back. The only other number in the chart, a work number, was a non-working number.  Since unable to reach patient, and she has not called to get Rx, prescription placed in box to go into chart in medical records.

## 2014-02-16 ENCOUNTER — Other Ambulatory Visit (HOSPITAL_COMMUNITY): Payer: Self-pay | Admitting: Family Medicine

## 2014-02-16 ENCOUNTER — Ambulatory Visit (HOSPITAL_COMMUNITY)
Admission: RE | Admit: 2014-02-16 | Discharge: 2014-02-16 | Disposition: A | Discharge status: Home / Self Care | Payer: Disability Insurance | Source: Ambulatory Visit | Attending: Family Medicine | Admitting: Family Medicine

## 2014-02-16 DIAGNOSIS — M542 Cervicalgia: Secondary | ICD-10-CM | POA: Insufficient documentation

## 2014-02-16 DIAGNOSIS — Z981 Arthrodesis status: Secondary | ICD-10-CM | POA: Diagnosis not present

## 2014-02-16 DIAGNOSIS — M47812 Spondylosis without myelopathy or radiculopathy, cervical region: Secondary | ICD-10-CM

## 2014-02-16 DIAGNOSIS — M47816 Spondylosis without myelopathy or radiculopathy, lumbar region: Secondary | ICD-10-CM

## 2015-03-15 ENCOUNTER — Other Ambulatory Visit: Payer: Self-pay | Admitting: Neurosurgery

## 2015-03-15 DIAGNOSIS — M47816 Spondylosis without myelopathy or radiculopathy, lumbar region: Secondary | ICD-10-CM

## 2015-04-12 ENCOUNTER — Encounter: Payer: Self-pay | Admitting: Pediatrics

## 2015-04-12 ENCOUNTER — Ambulatory Visit (INDEPENDENT_AMBULATORY_CARE_PROVIDER_SITE_OTHER): Payer: Medicaid Other | Admitting: Pediatrics

## 2015-04-12 VITALS — BP 139/86 | HR 93 | Temp 98.7°F | Ht 63.0 in | Wt 188.2 lb

## 2015-04-12 DIAGNOSIS — L219 Seborrheic dermatitis, unspecified: Secondary | ICD-10-CM

## 2015-04-12 DIAGNOSIS — E785 Hyperlipidemia, unspecified: Secondary | ICD-10-CM | POA: Insufficient documentation

## 2015-04-12 DIAGNOSIS — N951 Menopausal and female climacteric states: Secondary | ICD-10-CM | POA: Diagnosis not present

## 2015-04-12 DIAGNOSIS — I10 Essential (primary) hypertension: Secondary | ICD-10-CM | POA: Diagnosis not present

## 2015-04-12 DIAGNOSIS — Z23 Encounter for immunization: Secondary | ICD-10-CM

## 2015-04-12 DIAGNOSIS — F411 Generalized anxiety disorder: Secondary | ICD-10-CM | POA: Diagnosis not present

## 2015-04-12 DIAGNOSIS — E119 Type 2 diabetes mellitus without complications: Secondary | ICD-10-CM | POA: Insufficient documentation

## 2015-04-12 DIAGNOSIS — R350 Frequency of micturition: Secondary | ICD-10-CM | POA: Diagnosis not present

## 2015-04-12 LAB — POCT URINALYSIS DIPSTICK
BILIRUBIN UA: NEGATIVE
GLUCOSE UA: NEGATIVE
KETONES UA: NEGATIVE
LEUKOCYTES UA: NEGATIVE
Nitrite, UA: NEGATIVE
Protein, UA: NEGATIVE
SPEC GRAV UA: 1.015
Urobilinogen, UA: NEGATIVE
pH, UA: 6

## 2015-04-12 LAB — POCT GLYCOSYLATED HEMOGLOBIN (HGB A1C): Hemoglobin A1C: 7.1

## 2015-04-12 LAB — POCT UA - MICROSCOPIC ONLY
BACTERIA, U MICROSCOPIC: NEGATIVE
CASTS, UR, LPF, POC: NEGATIVE
CRYSTALS, UR, HPF, POC: NEGATIVE
MUCUS UA: NEGATIVE
WBC, Ur, HPF, POC: NEGATIVE
YEAST UA: NEGATIVE

## 2015-04-12 MED ORDER — LOVASTATIN 20 MG PO TABS: 40.0000 mg | ORAL_TABLET | Freq: Every day | ORAL | Status: DC

## 2015-04-12 MED ORDER — KETOCONAZOLE 2 % EX CREA: 1.0000 "application " | TOPICAL_CREAM | Freq: Every day | CUTANEOUS | Status: DC

## 2015-04-12 MED ORDER — CITALOPRAM HYDROBROMIDE 20 MG PO TABS: 20.0000 mg | ORAL_TABLET | Freq: Every day | ORAL | Status: DC

## 2015-04-12 NOTE — Progress Notes (Signed)
Subjective:    Patient ID: [name], female    DOB: [birth year], 54 y.o.   MRN: [id number]  CC: ear pain  HPI: [name] is a 54 y.o. female presenting on 04/12/2015 for Ear Pain  and follow-up of multiple medical problems.  She has ongoing neck pain and is followed by orthopedics. She is on chronic narcotic pain medicine for this.  She has a history of diabetes. She is not on insulin. Currently diet controlled.  She has had ear pain with a past few days. It started on the right side and now is on both sides. The pain is behind her ear on the skin. The skin is tender and it drains frequently. No pain inside her ears. No URI symptoms.  History of hypertension and hyperlipidemia, she takes medicines for both.  She is having frequent hot flashes. Has not tried any medicines for this.  She is having some urinary frequency. No dysuria. No fevers.  ROS: All other systems are negative other than what is in the history of present illness.  Past Medical History Patient Active Problem List   Diagnosis Date Noted  . Diabetes ([place]) 04/12/2015  . Essential hypertension 04/12/2015  . Hot flashes, menopausal 04/12/2015  . Generalized anxiety disorder 04/12/2015  . Hyperlipidemia 04/12/2015   Social History   Social History  . Marital Status: Married    Spouse Name: N/A  . Number of Children: N/A  . Years of Education: N/A   Occupational History  . Not on file.   Social History Main Topics  . Smoking status: Never Smoker   . Smokeless tobacco: Not on file  . Alcohol Use: No  . Drug Use: No  . Sexual Activity: Not on file   Other Topics Concern  . Not on file   Social History Narrative   History reviewed. No pertinent family history.   Current Outpatient Prescriptions  Medication Sig Dispense Refill  . albuterol (PROVENTIL) (2.5 MG/3ML) 0.083% nebulizer solution Take 2.5 mg by nebulization every 6 (six) hours as needed.    . hydrochlorothiazide  (HYDRODIURIL) 12.5 MG tablet Take 12.5 mg by mouth daily.    [name] lisinopril-hydrochlorothiazide (PRINZIDE,ZESTORETIC) 10-12.5 MG per tablet Take 1 tablet by mouth daily.    [name] lovastatin (MEVACOR) 20 MG tablet Take 2 tablets (40 mg total) by mouth at bedtime. 60 tablet 6  . oxyCODONE-acetaminophen (PERCOCET) 10-325 MG per tablet Take 1-2 tablets by mouth every 8 (eight) hours as needed for pain.    . citalopram (CELEXA) 20 MG tablet Take 1 tablet (20 mg total) by mouth daily. 30 tablet 3  . ketoconazole (NIZORAL) 2 % cream Apply 1 application topically daily. 30 g 0  . zolpidem (AMBIEN) 10 MG tablet Take 1 tablet (10 mg total) by mouth at bedtime as needed for sleep. (Patient not taking: Reported on 04/12/2015) 14 tablet 0   No current facility-administered medications for this visit.       Objective:    BP 139/86 mmHg  Pulse 93  Temp(Src) 98.7 F (37.1 C) (Oral)  Ht 5' 3"  (1.6 m)  Wt 188 lb 3.2 oz (85.367 kg)  BMI 33.35 kg/m2  Wt Readings from Last 3 Encounters:  04/12/15 188 lb 3.2 oz (85.367 kg)  10/16/13 160 lb (72.576 kg)  09/12/11 162 lb (73.483 kg)    Gen: NAD, alert, cooperative with exam, NCAT EYES: EOMI, no scleral injection or icterus ENT:  TMs pearly gray b/l, OP without erythema CV:  NRRR, normal S1/S2, no murmur, distal pulses 2+ b/l Resp: CTABL, no wheezes, normal WOB Abd: +BS, soft, NTND. no guarding or organomegaly Ext: No edema, warm Neuro: Alert and oriented MSK: Minimal ROM in neck Skin: skin behind b/l ears with yellow-crusting, some clear drainage. No breaks in the skin. No fluctuance. .     Assessment & Plan:   [name] was seen today for seborrheic dermatitis and follow-up multiple medical problems.  Diagnoses and all orders for this visit:  Type 2 diabetes mellitus without complication, without long-term current use of insulin ([name]) currently diet controlled per patient. We'll check the labs. No other complications that she knows of. RTC 2 weeks for DM  check up. -     POCT glycosylated hemoglobin (Hb A1C) -     Microalbumin/Creatinine Ratio, Urine  Essential hypertension Slightly elevated. Continue current meds. Will check below labs. Come back for recheck with DM visit in 2 weeks. -     BMP8+EGFR  Hot flashes, menopausal we'll start Celexa for hot flashes. Also hopefully will be able to wean off of the diazepam that she has at home. Currently taking the diazepam that she received from a previous provider at night, half a pill as needed to help him sleep. -     citalopram (CELEXA) 20 MG tablet; Take 1 tablet (20 mg total) by mouth daily.  Generalized anxiety disorder Celexa as above.  Hyperlipidemia continue lovastatin. -     Lipid panel -     lovastatin (MEVACOR) 20 MG tablet; Take 2 tablets (40 mg total) by mouth at bedtime.  Urinary frequency negative for infection. -     POCT urinalysis dipstick -     POCT UA - Microscopic Only  Seborrheic dermatitis behind both ears. -     ketoconazole (NIZORAL) 2 % cream; Apply 1 application topically daily.  Encounter for immunization  Other orders -     Flu Vaccine QUAD 36+ mos IM  Follow up plan: Return in about 2 weeks (around 04/26/2015) for diabetes and BP recheck.  Rachael Found, MD [facility] 04/12/2015, 11:26 AM

## 2015-04-12 NOTE — Patient Instructions (Addendum)
Take half a pill of citalopram (half will be 10mg ) once a day in the morning for 8 days. Then start taking a whole tab of citalopram (20mg ).  Call me in 2 weeks and let me know what your blood pressures have been.

## 2015-04-13 LAB — LIPID PANEL
CHOLESTEROL TOTAL: 151 mg/dL (ref 100–199)
Chol/HDL Ratio: 2.8 ratio units (ref 0.0–4.4)
HDL: 53 mg/dL (ref >39)
LDL Calculated: 55 mg/dL (ref 0–99)
Triglycerides: 215 mg/dL — ABNORMAL HIGH (ref 0–149)
VLDL Cholesterol Cal: 43 mg/dL — ABNORMAL HIGH (ref 5–40)

## 2015-04-13 LAB — BMP8+EGFR
BUN/Creatinine Ratio: 22 (ref 9–23)
BUN: 20 mg/dL (ref 6–24)
CALCIUM: 9.6 mg/dL (ref 8.7–10.2)
CHLORIDE: 94 mmol/L — AB (ref 97–106)
CO2: 27 mmol/L (ref 18–29)
Creatinine, Ser: 0.93 mg/dL (ref 0.57–1.00)
GFR calc non Af Amer: 71 mL/min/{1.73_m2} (ref >59)
GFR, EST AFRICAN AMERICAN: 82 mL/min/{1.73_m2} (ref >59)
GLUCOSE: 129 mg/dL — AB (ref 65–99)
POTASSIUM: 4.6 mmol/L (ref 3.5–5.2)
Sodium: 138 mmol/L (ref 136–144)

## 2015-04-13 LAB — MICROALBUMIN / CREATININE URINE RATIO
CREATININE, UR: 88.9 mg/dL
MICROALB/CREAT RATIO: 13.5 mg/g creat (ref 0.0–30.0)
Microalbumin, Urine: 12 ug/mL

## 2015-04-19 ENCOUNTER — Telehealth: Payer: Self-pay | Admitting: Pediatrics

## 2015-04-19 MED ORDER — LISINOPRIL-HYDROCHLOROTHIAZIDE 10-12.5 MG PO TABS
1.0000 | ORAL_TABLET | Freq: Every day | ORAL | Status: DC
Start: 2015-04-19 — End: 2015-10-21

## 2015-04-19 NOTE — Telephone Encounter (Signed)
done

## 2015-04-26 ENCOUNTER — Encounter: Payer: Self-pay | Admitting: Pediatrics

## 2015-04-26 ENCOUNTER — Ambulatory Visit (INDEPENDENT_AMBULATORY_CARE_PROVIDER_SITE_OTHER): Payer: Medicaid Other | Admitting: Pediatrics

## 2015-04-26 VITALS — BP 109/72 | HR 66 | Temp 97.2°F | Ht 63.0 in | Wt 190.2 lb

## 2015-04-26 DIAGNOSIS — E119 Type 2 diabetes mellitus without complications: Secondary | ICD-10-CM | POA: Diagnosis not present

## 2015-04-26 DIAGNOSIS — E785 Hyperlipidemia, unspecified: Secondary | ICD-10-CM | POA: Diagnosis not present

## 2015-04-26 DIAGNOSIS — I10 Essential (primary) hypertension: Secondary | ICD-10-CM | POA: Diagnosis not present

## 2015-04-26 DIAGNOSIS — L219 Seborrheic dermatitis, unspecified: Secondary | ICD-10-CM

## 2015-04-26 DIAGNOSIS — F411 Generalized anxiety disorder: Secondary | ICD-10-CM | POA: Diagnosis not present

## 2015-04-26 MED ORDER — TRIAMCINOLONE ACETONIDE 0.025 % EX OINT
1.0000 | TOPICAL_OINTMENT | Freq: Every day | CUTANEOUS | Status: DC
Start: 2015-04-26 — End: 2016-10-21

## 2015-04-26 MED ORDER — ALPRAZOLAM 0.5 MG PO TABS: 0.5000 mg | ORAL_TABLET | Freq: Every evening | ORAL | Status: DC | PRN

## 2015-04-26 MED ORDER — KETOCONAZOLE 2 % EX CREA: 1.0000 "application " | TOPICAL_CREAM | Freq: Every day | CUTANEOUS | Status: DC

## 2015-04-26 NOTE — Patient Instructions (Signed)
Increase celexa to one tablet once a day Use xanax at night as needed. After two weeks can try decreasing to half at night.

## 2015-04-26 NOTE — Progress Notes (Signed)
Subjective:    Patient ID: [name] Singley, female    DOB: 1960-07-22, 54 y.o.   MRN: [id number]  CC: f/u ears, DM2  HPI: [name] Cavanaugh is a 54 y.o. female presenting on 04/26/2015 for Follow-up  Ears are improving but still itching. No longer draining. Been on ketoconazole cream for seborrheic dermatitis for apprx two weeks.  DM2: diet controlled. Due for eye exam.   Menopause: started on celexa, 1/2 a pill a day has decreased hot flashes significantly  Insomnia/anxiety: tried two days last week without xanax, woke up in panic attacks, restarted xanax 1mg  qhs.   Osteoarthritis: followed by ortho. H/o trauma, still possibly going to get neck surgery per pt. Takes hydrocodone apprx 2 times a day for knee pain  No fevers, no SOB or CP, anxiety is controlled during day, bothered most at night.  Relevant past medical, surgical, family and social history reviewed and updated as indicated. Interim medical history since our last visit reviewed. Allergies and medications reviewed and updated.   ROS: Per HPI unless specifically indicated above  Past Medical History Patient Active Problem List   Diagnosis Date Noted  . Diabetes ([facility]) 04/12/2015  . Essential hypertension 04/12/2015  . Hot flashes, menopausal 04/12/2015  . Generalized anxiety disorder 04/12/2015  . Hyperlipidemia 04/12/2015    Current Outpatient Prescriptions  Medication Sig Dispense Refill  . albuterol (PROVENTIL) (2.5 MG/3ML) 0.083% nebulizer solution Take 2.5 mg by nebulization every 6 (six) hours as needed.    . citalopram (CELEXA) 20 MG tablet Take 1 tablet (20 mg total) by mouth daily. 30 tablet 3  . hydrochlorothiazide (HYDRODIURIL) 12.5 MG tablet Take 12.5 mg by mouth daily.    [name]. HYDROcodone-acetaminophen (NORCO) 10-325 MG tablet Take 1 tablet by mouth every 6 (six) hours as needed.    [name]. ketoconazole (NIZORAL) 2 % cream Apply 1 application topically daily. 30 g 0  . lisinopril-hydrochlorothiazide  (PRINZIDE,ZESTORETIC) 10-12.5 MG tablet Take 1 tablet by mouth daily. 30 tablet 5  . lovastatin (MEVACOR) 20 MG tablet Take 2 tablets (40 mg total) by mouth at bedtime. 60 tablet 6  . ALPRAZolam (XANAX) 0.5 MG tablet Take 1 tablet (0.5 mg total) by mouth at bedtime as needed for anxiety. 30 tablet 1  . triamcinolone (KENALOG) 0.025 % ointment Apply 1 application topically daily. As needed for itch 30 g 0   No current facility-administered medications for this visit.       Objective:    BP 109/72 mmHg  Pulse 66  Temp(Src) 97.2 F (36.2 C) (Oral)  Ht 5\' 3"  (1.6 m)  Wt 190 lb 3.2 oz (86.274 kg)  BMI 33.70 kg/m2  Wt Readings from Last 3 Encounters:  04/26/15 190 lb 3.2 oz (86.274 kg)  04/12/15 188 lb 3.2 oz (85.367 kg)  10/16/13 160 lb (72.576 kg)    Gen: NAD, alert, cooperative with exam EYES: EOMI, no scleral injection or icterus ENT:  TMs pearly gray b/l, OP without erythema LYMPH: no cervical LAD CV: NRRR, normal S1/S2, no murmur, distal pulses 2+ b/l including DP, PT Resp: CTABL, no wheezes, normal WOB Neuro: Alert and oriented, intact sensation to feet b/l with light touch and monofilament MSK: limited ROM with neck Skin: white crust behind ears b/l, minimal drainage, skin is intact, no erythema     Assessment & Plan:   Carney BernWendell was seen today for follow-up multiple med conditions.  Diagnoses and all orders for this visit:  Type 2 diabetes mellitus without complication, without  long-term current use of insulin ([facility]) Diet controlled. HgA1c 7.1 in 03/2015. No neurpathy. Foot exam done today. Due for eye exam. Pt to call to schedule.   Seborrheic dermatitis Behind ears b/l. Improving with ketoconazole. Still itching. Continue ketoconazole, triamcinolone once a day for itch if needed. -     ketoconazole (NIZORAL) 2 % cream; Apply 1 application topically daily. -     triamcinolone (KENALOG) 0.025 % ointment; Apply 1 application topically daily. As needed for  itch  Generalized anxiety disorder Tried stopping xanax and had panic attacks. Discussed risk of respiratory suppression when taking both narcotics and medicines like xanax. Pt says she is aware, tries to separate when she takes them. Pt open to trying 0,5 mg qhs for control of panic at night instead of . #30 tabs given with 1 refill. Must be seen for further refills, pt is aware. Also going to increase celexa from  to .  -     ALPRAZolam (XANAX) 0.5 MG tablet; Take 1 tablet (0.5 mg total) by mouth at bedtime as needed for anxiety.  Hyperlipidemia Well controlled on lovastatin. Continue.  Essential hypertension Well controlled on current meds. Last BMP 03/2015. Continue current meds.  Follow up plan: Return in about 8 weeks (around 06/21/2015).  Rex Kras, MD [facility] 04/26/2015, 8:52 AM

## 2015-05-22 ENCOUNTER — Encounter: Payer: Self-pay | Admitting: Pediatrics

## 2015-06-18 ENCOUNTER — Other Ambulatory Visit: Payer: Disability Insurance

## 2015-06-22 ENCOUNTER — Ambulatory Visit
Admission: RE | Admit: 2015-06-22 | Discharge: 2015-06-22 | Disposition: A | Discharge status: Home / Self Care | Payer: Disability Insurance | Source: Ambulatory Visit | Attending: Neurosurgery | Admitting: Neurosurgery

## 2015-06-22 DIAGNOSIS — M47816 Spondylosis without myelopathy or radiculopathy, lumbar region: Secondary | ICD-10-CM

## 2015-08-05 ENCOUNTER — Ambulatory Visit: Payer: Medicaid Other | Admitting: Pediatrics

## 2015-08-06 ENCOUNTER — Encounter: Payer: Self-pay | Admitting: Pediatrics

## 2015-08-21 ENCOUNTER — Ambulatory Visit: Payer: Medicaid Other | Admitting: Pediatrics

## 2015-08-22 ENCOUNTER — Encounter: Payer: Self-pay | Admitting: Pediatrics

## 2015-10-21 ENCOUNTER — Other Ambulatory Visit: Payer: Self-pay | Admitting: Pediatrics

## 2015-10-29 ENCOUNTER — Ambulatory Visit (INDEPENDENT_AMBULATORY_CARE_PROVIDER_SITE_OTHER): Payer: Medicaid Other | Admitting: Family Medicine

## 2015-10-29 ENCOUNTER — Encounter: Payer: Self-pay | Admitting: Family Medicine

## 2015-10-29 VITALS — BP 130/91 | HR 97 | Temp 97.8°F | Ht 63.0 in | Wt 192.4 lb

## 2015-10-29 DIAGNOSIS — F411 Generalized anxiety disorder: Secondary | ICD-10-CM

## 2015-10-29 DIAGNOSIS — E785 Hyperlipidemia, unspecified: Secondary | ICD-10-CM

## 2015-10-29 DIAGNOSIS — N951 Menopausal and female climacteric states: Secondary | ICD-10-CM

## 2015-10-29 MED ORDER — CITALOPRAM HYDROBROMIDE 20 MG PO TABS: 20.0000 mg | ORAL_TABLET | Freq: Every day | ORAL | Status: DC

## 2015-10-29 MED ORDER — LISINOPRIL-HYDROCHLOROTHIAZIDE 10-12.5 MG PO TABS: 1.0000 | ORAL_TABLET | Freq: Every day | ORAL | Status: DC

## 2015-10-29 MED ORDER — ALPRAZOLAM 0.5 MG PO TABS: 0.5000 mg | ORAL_TABLET | Freq: Every evening | ORAL | Status: DC | PRN

## 2015-10-29 MED ORDER — LOVASTATIN 20 MG PO TABS: 40.0000 mg | ORAL_TABLET | Freq: Every day | ORAL | Status: DC

## 2015-10-29 NOTE — Patient Instructions (Signed)
Great to see you!  I have refilled your medicines  Lets plan to see you again in 3 months, unless you still do not have insurance coverage, in which case you can be seen in 6 months

## 2015-10-29 NOTE — Progress Notes (Signed)
   HPI  Patient presents today here for follow-up.  Patient explains that she is doing well overall.  She's been out of all of her medications for 3 days including blood pressure medication and Celexa.  Anxiety Very much improved on Celexa Taking a neck as needed Denies any depression or suicidal ideation. States that also helps her hot flashes.  Hyperlipidemia She's watching her diet Unable to exercise regularly due to chronic knee and back pain.  Hypertension Reports good compliance, out of medications for 3 days Denies chest pain, dyspnea, palpitations, leg edema.  She states that she lost her insurance last month. She got two dollar raise at work and that disqualified her for Medicaid.   PMH: Smoking status noted ROS: Per HPI  Objective: BP 130/91 mmHg  Pulse 97  Temp(Src) 97.8 F (36.6 C) (Oral)  Ht 5\' 3"  (1.6 m)  Wt 192 lb 6.4 oz (87.272 kg)  BMI 34.09 kg/m2 Gen: NAD, alert, cooperative with exam HEENT: NCAT CV: RRR, good S1/S2, no murmur Resp: CTABL, no wheezes, non-labored Ext: No edema, warm Neuro: Alert and oriented, No gross deficits  Assessment and plan:  # Hypertension Reasonable control, she is off of her medication today Refill only Prinzide, she will check to see if she takes HCTZ as well Plan repeat BMP in the fall  # Hyperlipidemia Tolerating pravastatin well - refilled   # Hot flashes Helped by celexa, continue   # GAD Helped by celexa, reports good improvement PRN xanax refilled with 1 refill  Meds ordered this encounter  Medications  . ALPRAZolam (XANAX) 0.5 MG tablet    Sig: Take 1 tablet (0.5 mg total) by mouth at bedtime as needed for anxiety.    Dispense:  30 tablet    Refill:  1  . lisinopril-hydrochlorothiazide (PRINZIDE,ZESTORETIC) 10-12.5 MG tablet    Sig: Take 1 tablet by mouth daily.    Dispense:  30 tablet    Refill:  5  . lovastatin (MEVACOR) 20 MG tablet    Sig: Take 2 tablets (40 mg total) by mouth at bedtime.     Dispense:  60 tablet    Refill:  5  . citalopram (CELEXA) 20 MG tablet    Sig: Take 1 tablet (20 mg total) by mouth daily.    Dispense:  30 tablet    Refill:  5    Murtis SinkSam Arvon Schreiner, MD [facility] 10/29/2015, 3:13 PM

## 2016-05-29 ENCOUNTER — Other Ambulatory Visit: Payer: Self-pay | Admitting: Family Medicine

## 2016-06-04 ENCOUNTER — Other Ambulatory Visit: Payer: Self-pay | Admitting: Family Medicine

## 2016-06-04 DIAGNOSIS — E785 Hyperlipidemia, unspecified: Secondary | ICD-10-CM

## 2016-07-27 ENCOUNTER — Other Ambulatory Visit: Payer: Self-pay | Admitting: *Deleted

## 2016-07-27 DIAGNOSIS — E7849 Other hyperlipidemia: Secondary | ICD-10-CM

## 2016-07-27 MED ORDER — LOVASTATIN 20 MG PO TABS: ORAL_TABLET | ORAL | 0 refills | Status: DC

## 2016-07-27 MED ORDER — LISINOPRIL-HYDROCHLOROTHIAZIDE 10-12.5 MG PO TABS: 1.0000 | ORAL_TABLET | Freq: Every day | ORAL | 0 refills | Status: DC

## 2016-08-04 ENCOUNTER — Ambulatory Visit: Payer: Self-pay | Admitting: Pediatrics

## 2016-08-05 ENCOUNTER — Encounter: Payer: Self-pay | Admitting: Pediatrics

## 2016-08-05 ENCOUNTER — Telehealth: Payer: Self-pay | Admitting: Pediatrics

## 2016-08-20 ENCOUNTER — Ambulatory Visit: Payer: Self-pay | Admitting: Pediatrics

## 2016-08-21 ENCOUNTER — Encounter: Payer: Self-pay | Admitting: Pediatrics

## 2016-08-24 ENCOUNTER — Ambulatory Visit (INDEPENDENT_AMBULATORY_CARE_PROVIDER_SITE_OTHER): Payer: Self-pay | Admitting: Pediatrics

## 2016-08-24 VITALS — BP 128/78 | HR 116 | Temp 98.3°F | Ht 63.0 in | Wt 197.0 lb

## 2016-08-24 DIAGNOSIS — F32A Depression, unspecified: Secondary | ICD-10-CM

## 2016-08-24 DIAGNOSIS — N951 Menopausal and female climacteric states: Secondary | ICD-10-CM

## 2016-08-24 DIAGNOSIS — E784 Other hyperlipidemia: Secondary | ICD-10-CM

## 2016-08-24 DIAGNOSIS — F411 Generalized anxiety disorder: Secondary | ICD-10-CM

## 2016-08-24 DIAGNOSIS — E7849 Other hyperlipidemia: Secondary | ICD-10-CM

## 2016-08-24 DIAGNOSIS — F329 Major depressive disorder, single episode, unspecified: Secondary | ICD-10-CM

## 2016-08-24 DIAGNOSIS — E785 Hyperlipidemia, unspecified: Secondary | ICD-10-CM

## 2016-08-24 DIAGNOSIS — E119 Type 2 diabetes mellitus without complications: Secondary | ICD-10-CM

## 2016-08-24 DIAGNOSIS — I10 Essential (primary) hypertension: Secondary | ICD-10-CM

## 2016-08-24 LAB — GLUCOSE HEMOCUE WAIVED: Glu Hemocue Waived: 176 mg/dL — ABNORMAL HIGH (ref 65–99)

## 2016-08-24 LAB — BAYER DCA HB A1C WAIVED: HB A1C (BAYER DCA - WAIVED): 10.3 % — ABNORMAL HIGH (ref <7.0)

## 2016-08-24 MED ORDER — CITALOPRAM HYDROBROMIDE 20 MG PO TABS: 40.0000 mg | ORAL_TABLET | Freq: Every day | ORAL | 5 refills | Status: DC

## 2016-08-24 MED ORDER — LISINOPRIL-HYDROCHLOROTHIAZIDE 20-25 MG PO TABS
1.0000 | ORAL_TABLET | Freq: Every day | ORAL | 3 refills | Status: DC
Start: 2016-08-24 — End: 2016-12-14

## 2016-08-24 MED ORDER — ALPRAZOLAM 0.5 MG PO TABS: 0.5000 mg | ORAL_TABLET | Freq: Every evening | ORAL | 1 refills | Status: DC | PRN

## 2016-08-24 MED ORDER — LOVASTATIN 20 MG PO TABS: ORAL_TABLET | ORAL | 3 refills | Status: DC

## 2016-08-24 MED ORDER — METFORMIN HCL 500 MG PO TABS: 500.0000 mg | ORAL_TABLET | Freq: Two times a day (BID) | ORAL | 3 refills | Status: DC

## 2016-08-24 NOTE — Patient Instructions (Signed)
Check blood sugars in the morning before eating anything a few times a week Let me know if it is over 150 in the morning after being on the metformin for two weeks Avoid sugary drinks, carbohydrates

## 2016-08-24 NOTE — Progress Notes (Signed)
Subjective:   Patient ID: [name], female    DOB: 03/16/1961, 56 y.o.   MRN: [id number] CC: Hyperglycemia and Hypertension  HPI: [name] is a 56 y.o. female presenting for Hyperglycemia and Hypertension  Passive thoughts about wanting to not be here No plan for self harm Feels safe at home Husband with health problems Daughter struggling with school Stress about money at home Anxiety also ongoing problem Taking xanax at night to help with racing thoughts   Ongoing back pain problems, followed by outside clinic  Not eating much she says, partly because of expense Usually two meals a day  No HA, no CP, no SOB Taking BP meds regularly  HLD: taking statin daily  Depression screen [facility] 2/9 08/24/2016 10/29/2015 04/12/2015  Decreased Interest 3 3 0  Down, Depressed, Hopeless 3 2 0  PHQ - 2 Score 6 5 0  Altered sleeping 3 3 -  Tired, decreased energy 3 0 -  Change in appetite 3 3 -  Feeling bad or failure about yourself  3 3 -  Trouble concentrating 0 0 -  Moving slowly or fidgety/restless 0 0 -  Suicidal thoughts 2 1 -  PHQ-9 Score 20 15 -  Difficult doing work/chores Very difficult Somewhat difficult -     Relevant past medical, surgical, family and social history reviewed. Allergies and medications reviewed and updated. History  Smoking Status  . Never Smoker  Smokeless Tobacco  . Not on file   ROS: Per HPI   Objective:    BP 128/78   Pulse (!) 116   Temp 98.3 F (36.8 C) (Oral)   Ht _0  (1.6 m)   Wt 197 lb (89.4 kg)   BMI 34.90 kg/m   Wt Readings from Last 3 Encounters:  08/24/16 197 lb (89.4 kg)  10/29/15 192 lb 6.4 oz (87.3 kg)  04/26/15 190 lb 3.2 oz (86.3 kg)    Gen: NAD, alert, cooperative with exam, NCAT EYES: EOMI, no conjunctival injection, or no icterus CV: NRRR, normal S1/S2, no murmur\ Resp: CTABL, no wheezes, normal WOB Abd: +BS, soft, NTND. no guarding or organomegaly Ext: No edema, warm Neuro: Alert and oriented,  strength equal b/l UE and LE, coordination grossly normal Psych: full affect, denies plan for self harm, passive thoughts about not wanting to be here  Assessment & Plan:  [name] was seen today for hyperglycemia and hypertension.  Diagnoses and all orders for this visit:  Type 2 diabetes mellitus without complication, without long-term current use of insulin (HCC) Poorly uncontrolled a1c 10.3, up from 7.1 not on medications Gave glucometer today Check BGls regularly, decrease sugary foods Take metformin BID Called me BGLs in 2 week, will start second agent, likely SU given no insurance -     Bayer DCA Hb A1c Waived -     Glucose Hemocue Waived -     BMP8+EGFR -     metFORMIN (GLUCOPHAGE) 500 MG tablet; Take 1 tablet (500 mg total) by mouth 2 (two) times daily with a meal.  Hyperlipidemia, unspecified hyperlipidemia type Cont lovastatin -     BMP8+EGFR  Hot flashes, menopausal Cont ssri, has been helping some, ran out -     citalopram (CELEXA) 20 MG tablet; Take 2 tablets (40 mg total) by mouth daily.  Other hyperlipidemia -     lovastatin (MEVACOR) 20 MG tablet; TAKE TWO TABLETS BY MOUTH ONCE DAILY AT BEDTIME  Generalized anxiety disorder Goal to get off of this med, discussed  danger of bein gon benzos and narcotics Gave Rx for #30 tabs with one refill Taking at night, not every night Ideally will not need any further refills, must be seen -     ALPRAZolam (XANAX) 0.5 MG tablet; Take 1 tablet (0.5 mg total) by mouth at bedtime as needed for anxiety.  Essential hypertension Adequate control, cont meds -     lisinopril-hydrochlorothiazide (PRINZIDE,ZESTORETIC) 20-25 MG tablet; Take 1 tablet by mouth daily.  Depression, unspecified depression type Passive SI, no plan, not worsening, but not improving Feels safe at home Cares for husband, daughter Increasing citalopram to 60m Says will call if any worsening in symptoms  Follow up plan: Return in about 3 months (around  11/24/2016). [place]un[place] Found [place]

## 2016-08-25 LAB — BMP8+EGFR
BUN / CREAT RATIO: 11 (ref 9–23)
BUN: 8 mg/dL (ref 6–24)
CO2: 25 mmol/L (ref 18–29)
CREATININE: 0.71 mg/dL (ref 0.57–1.00)
Calcium: 9.1 mg/dL (ref 8.7–10.2)
Chloride: 96 mmol/L (ref 96–106)
GFR, EST AFRICAN AMERICAN: 112 mL/min/{1.73_m2} (ref >59)
GFR, EST NON AFRICAN AMERICAN: 98 mL/min/{1.73_m2} (ref >59)
Glucose: 213 mg/dL — ABNORMAL HIGH (ref 65–99)
Potassium: 4.4 mmol/L (ref 3.5–5.2)
SODIUM: 138 mmol/L (ref 134–144)

## 2016-08-26 ENCOUNTER — Encounter: Payer: Self-pay | Admitting: Pediatrics

## 2016-08-26 ENCOUNTER — Telehealth: Payer: Self-pay | Admitting: Pediatrics

## 2016-08-26 DIAGNOSIS — E7849 Other hyperlipidemia: Secondary | ICD-10-CM

## 2016-08-26 DIAGNOSIS — E119 Type 2 diabetes mellitus without complications: Secondary | ICD-10-CM

## 2016-08-26 DIAGNOSIS — F329 Major depressive disorder, single episode, unspecified: Secondary | ICD-10-CM | POA: Insufficient documentation

## 2016-08-26 DIAGNOSIS — F32A Depression, unspecified: Secondary | ICD-10-CM | POA: Insufficient documentation

## 2016-08-27 MED ORDER — LOVASTATIN 20 MG PO TABS: ORAL_TABLET | ORAL | 3 refills | Status: DC

## 2016-08-27 NOTE — Telephone Encounter (Signed)
Mevacor rx sent to pharmacy and left detailed message on pt voicemail that requested rx had been sent and to call back with any further questions or concerns.

## 2016-09-22 ENCOUNTER — Telehealth: Payer: Self-pay | Admitting: *Deleted

## 2016-09-22 ENCOUNTER — Telehealth: Payer: Self-pay | Admitting: Pediatrics

## 2016-09-22 NOTE — Telephone Encounter (Signed)
Per pt,  Metformin is causing severe nausea with some vomitting She has almost taken x 1 mth Can med be changed to something else Please advise

## 2016-09-22 NOTE — Telephone Encounter (Signed)
Patient talked to triage °

## 2016-09-24 MED ORDER — GLIMEPIRIDE 2 MG PO TABS: 2.0000 mg | ORAL_TABLET | Freq: Every day | ORAL | 1 refills | Status: DC

## 2016-09-24 NOTE — Telephone Encounter (Signed)
Sent in glimepiride , take every morning Check BGLs fasting in the morning, let m eknow if over 150

## 2016-09-24 NOTE — Telephone Encounter (Signed)
Left voicemail for patient to return call.

## 2016-10-07 NOTE — Telephone Encounter (Signed)
Several attempts have been made to contact patient- this encounter will now be closed.

## 2016-10-07 NOTE — Telephone Encounter (Signed)
lmtcb

## 2016-10-21 ENCOUNTER — Emergency Department (HOSPITAL_COMMUNITY)
Admission: EM | Admit: 2016-10-21 | Discharge: 2016-10-21 | Disposition: A | Discharge status: Home / Self Care | Payer: Medicare Other | Attending: Emergency Medicine | Admitting: Emergency Medicine

## 2016-10-21 ENCOUNTER — Encounter (HOSPITAL_COMMUNITY): Payer: Self-pay | Admitting: Emergency Medicine

## 2016-10-21 DIAGNOSIS — E860 Dehydration: Secondary | ICD-10-CM

## 2016-10-21 DIAGNOSIS — I10 Essential (primary) hypertension: Secondary | ICD-10-CM | POA: Diagnosis not present

## 2016-10-21 DIAGNOSIS — J45909 Unspecified asthma, uncomplicated: Secondary | ICD-10-CM | POA: Insufficient documentation

## 2016-10-21 DIAGNOSIS — Z7984 Long term (current) use of oral hypoglycemic drugs: Secondary | ICD-10-CM | POA: Diagnosis not present

## 2016-10-21 DIAGNOSIS — E119 Type 2 diabetes mellitus without complications: Secondary | ICD-10-CM | POA: Diagnosis not present

## 2016-10-21 DIAGNOSIS — R1110 Vomiting, unspecified: Secondary | ICD-10-CM | POA: Diagnosis present

## 2016-10-21 LAB — CBC WITH DIFFERENTIAL/PLATELET
Basophils Absolute: 0 10*3/uL (ref 0.0–0.1)
Basophils Relative: 0 %
EOS ABS: 0.1 10*3/uL (ref 0.0–0.7)
EOS PCT: 1 %
HCT: 41.7 % (ref 36.0–46.0)
Hemoglobin: 14.7 g/dL (ref 12.0–15.0)
LYMPHS ABS: 1.8 10*3/uL (ref 0.7–4.0)
LYMPHS PCT: 17 %
MCH: 29.2 pg (ref 26.0–34.0)
MCHC: 35.3 g/dL (ref 30.0–36.0)
MCV: 82.9 fL (ref 78.0–100.0)
MONO ABS: 0.8 10*3/uL (ref 0.1–1.0)
Monocytes Relative: 8 %
Neutro Abs: 7.8 10*3/uL — ABNORMAL HIGH (ref 1.7–7.7)
Neutrophils Relative %: 74 %
PLATELETS: 357 10*3/uL (ref 150–400)
RBC: 5.03 MIL/uL (ref 3.87–5.11)
RDW: 13.2 % (ref 11.5–15.5)
WBC: 10.5 10*3/uL (ref 4.0–10.5)

## 2016-10-21 LAB — COMPREHENSIVE METABOLIC PANEL WITH GFR
ALT: 35 U/L (ref 14–54)
AST: 27 U/L (ref 15–41)
Albumin: 4.8 g/dL (ref 3.5–5.0)
Alkaline Phosphatase: 68 U/L (ref 38–126)
Anion gap: 14 (ref 5–15)
BUN: 17 mg/dL (ref 6–20)
CO2: 26 mmol/L (ref 22–32)
Calcium: 9.6 mg/dL (ref 8.9–10.3)
Chloride: 95 mmol/L — ABNORMAL LOW (ref 101–111)
Creatinine, Ser: 1.1 mg/dL — ABNORMAL HIGH (ref 0.44–1.00)
GFR calc Af Amer: >60 mL/min
GFR calc non Af Amer: 56 mL/min — ABNORMAL LOW
Glucose, Bld: 129 mg/dL — ABNORMAL HIGH (ref 65–99)
Potassium: 3.4 mmol/L — ABNORMAL LOW (ref 3.5–5.1)
Sodium: 135 mmol/L (ref 135–145)
Total Bilirubin: 0.5 mg/dL (ref 0.3–1.2)
Total Protein: 8.9 g/dL — ABNORMAL HIGH (ref 6.5–8.1)

## 2016-10-21 MED ORDER — PANTOPRAZOLE SODIUM 40 MG IV SOLR
40.0000 mg | Freq: Once | INTRAVENOUS | Status: AC
  Administered 2016-10-21: 40 mg via INTRAVENOUS
  Filled 2016-10-21: qty 40

## 2016-10-21 MED ORDER — ONDANSETRON HCL 4 MG/2ML IJ SOLN
4.0000 mg | Freq: Once | INTRAMUSCULAR | Status: AC
  Administered 2016-10-21: 4 mg via INTRAVENOUS
  Filled 2016-10-21: qty 2

## 2016-10-21 MED ORDER — ONDANSETRON 4 MG PO TBDP: ORAL_TABLET | ORAL | 0 refills | Status: DC

## 2016-10-21 MED ORDER — SODIUM CHLORIDE 0.9 % IV BOLUS (SEPSIS)
2000.0000 mL | Freq: Once | INTRAVENOUS | Status: AC
  Administered 2016-10-21: 2000 mL via INTRAVENOUS

## 2016-10-21 NOTE — Discharge Instructions (Signed)
Drink plenty of fluids and follow-up with her family doctor next week for recheck

## 2016-10-21 NOTE — ED Triage Notes (Signed)
Pt c/o n/v since yesterday. Pt brought in by ems. cgb in route 116.gen weakness noted. Mm moist. Denies pain.

## 2016-10-21 NOTE — ED Provider Notes (Signed)
[facility]Y DEPT Provider Note   CSN: [id number] Arrival date & time: 10/21/16  1847     History   Chief Complaint Chief Complaint  Patient presents with  . Emesis    HPI [name] is a 56 y.o. female.  Patient complains of vomiting today no diarrhea and no abdominal pain no fever   The history is provided by the patient.  Emesis   This is a new problem. The current episode started 12 to 24 hours ago. The problem occurs 2 to 4 times per day. The problem has not changed since onset.The emesis has an appearance of stomach contents. There has been no fever. Pertinent negatives include no abdominal pain, no chills, no cough, no diarrhea and no headaches.    Past Medical History:  Diagnosis Date  . Anxiety   . Arthritis   . Asthma   . Diabetes mellitus   . Hypertension   . Polycystic ovarian syndrome     Patient Active Problem List   Diagnosis Date Noted  . Depression 08/26/2016  . Diabetes (HCC) 04/12/2015  . Essential hypertension 04/12/2015  . Hot flashes, menopausal 04/12/2015  . Generalized anxiety disorder 04/12/2015  . Hyperlipidemia 04/12/2015    Past Surgical History:  Procedure Laterality Date  . CERVICAL FUSION      OB History    No data available       Home Medications    Prior to Admission medications   Medication Sig Start Date End Date Taking? Authorizing Provider  albuterol (PROVENTIL) (2.5 MG/3ML) 0.083% nebulizer solution Take 2.5 mg by nebulization every 6 (six) hours as needed.   Yes Historical Provider, MD  ALPRAZolam Prudy Feeler) 0.5 MG tablet Take 1 tablet (0.5 mg total) by mouth at bedtime as needed for anxiety. 08/24/16  Yes Johna Sheriff, MD  citalopram (CELEXA) 20 MG tablet Take 2 tablets (40 mg total) by mouth daily. 08/24/16  Yes Johna Sheriff, MD  glimepiride (AMARYL) 2 MG tablet Take 1 tablet (2 mg total) by mouth daily with breakfast. 09/24/16  Yes Johna Sheriff, MD  HYDROcodone-acetaminophen (NORCO) 10-325 MG tablet Take  1 tablet by mouth every 6 (six) hours as needed.   Yes Historical Provider, MD  lisinopril-hydrochlorothiazide (PRINZIDE,ZESTORETIC) 20-25 MG tablet Take 1 tablet by mouth daily. 08/24/16  Yes Johna Sheriff, MD  lovastatin (MEVACOR) 20 MG tablet TAKE TWO TABLETS BY MOUTH ONCE DAILY AT BEDTIME 08/27/16  Yes Johna Sheriff, MD  metFORMIN (GLUCOPHAGE) 500 MG tablet Take 1 tablet (500 mg total) by mouth 2 (two) times daily with a meal. 08/24/16  Yes Johna Sheriff, MD  ondansetron (ZOFRAN ODT) 4 MG disintegrating tablet  ODT q4 hours prn nausea/vomit 10/21/16   Bethann Berkshire, MD    Family History History reviewed. No pertinent family history.  Social History Social History  Substance Use Topics  . Smoking status: Never Smoker  . Smokeless tobacco: Never Used  . Alcohol use No     Allergies   Penicillins   Review of Systems Review of Systems  Constitutional: Negative for appetite change, chills and fatigue.  HENT: Negative for congestion, ear discharge and sinus pressure.   Eyes: Negative for discharge.  Respiratory: Negative for cough.   Cardiovascular: Negative for chest pain.  Gastrointestinal: Positive for vomiting. Negative for abdominal pain and diarrhea.  Genitourinary: Negative for frequency and hematuria.  Musculoskeletal: Negative for back pain.  Skin: Negative for rash.  Neurological: Negative for seizures and headaches.  Psychiatric/Behavioral: Negative  for hallucinations.     Physical Exam Updated Vital Signs BP (!) 146/95   Pulse (!) 124   Temp 98.6 F (37 C)   Resp 18   LMP  (LMP Unknown)   SpO2 97%   Physical Exam  Constitutional: She is oriented to person, place, and time. She appears well-developed.  HENT:  Head: Normocephalic.  Eyes: Conjunctivae and EOM are normal. No scleral icterus.  Neck: Neck supple. No thyromegaly present.  Cardiovascular: Normal rate and regular rhythm.  Exam reveals no gallop and no friction rub.   No murmur  heard. Pulmonary/Chest: No stridor. She has no wheezes. She has no rales. She exhibits no tenderness.  Abdominal: She exhibits no distension. There is no tenderness. There is no rebound.  Musculoskeletal: Normal range of motion. She exhibits no edema.  Lymphadenopathy:    She has no cervical adenopathy.  Neurological: She is oriented to person, place, and time. She exhibits normal muscle tone. Coordination normal.  Skin: No rash noted. No erythema.  Psychiatric: She has a normal mood and affect. Her behavior is normal.     ED Treatments / Results  Labs (all labs ordered are listed, but only abnormal results are displayed) Labs Reviewed  CBC WITH DIFFERENTIAL/PLATELET - Abnormal; Notable for the following:       Result Value   Neutro Abs 7.8 (*)    All other components within normal limits  COMPREHENSIVE METABOLIC PANEL - Abnormal; Notable for the following:    Potassium 3.4 (*)    Chloride 95 (*)    Glucose, Bld 129 (*)    Creatinine, Ser 1.10 (*)    Total Protein 8.9 (*)    GFR calc non Af Amer 56 (*)    All other components within normal limits    EKG  EKG Interpretation None       Radiology No results found.  Procedures Procedures (including critical care time)  Medications Ordered in ED Medications  sodium chloride 0.9 % bolus 2,000 mL (2,000 mLs Intravenous New Bag/Given 10/21/16 1921)  ondansetron (ZOFRAN) injection 4 mg (4 mg Intravenous Given 10/21/16 1924)  pantoprazole (PROTONIX) injection 40 mg (40 mg Intravenous Given 10/21/16 1924)     Initial Impression / Assessment and Plan / ED Course  I have reviewed the triage vital signs and the nursing notes.  Pertinent labs & imaging results that were available during my care of the patient were reviewed by me and considered in my medical decision making (see chart for details).     Labs unremarkable. Patient improved with fluids nausea medicine. She will be sent home with Zofran and will follow-up with her  PCP  Final Clinical Impressions(s) / ED Diagnoses   Final diagnoses:  Dehydration    New Prescriptions New Prescriptions   ONDANSETRON (ZOFRAN ODT) 4 MG DISINTEGRATING TABLET     ODT q4 hours prn nausea/vomit     Bethann Berkshire, MD 10/21/16 2202

## 2016-10-28 ENCOUNTER — Ambulatory Visit: Payer: Self-pay | Admitting: Pediatrics

## 2016-11-23 ENCOUNTER — Telehealth: Payer: Self-pay | Admitting: *Deleted

## 2016-11-23 NOTE — Telephone Encounter (Signed)
Incoming call from pt stating she felt sluggish Checked CBG, it was 66 Ate 1/2 peanut butter sandwich  Rechecked CBG, dropped to 58 Instructed pt to drink milk or Orange juice Will call back to check in 15-20 Called pt back CBG is now 6588 Instructed pt to eat small snacks to keep this from recurring Pt has appt on 11/24/2016 to discuss meds

## 2016-11-24 ENCOUNTER — Other Ambulatory Visit: Payer: Self-pay | Admitting: Pediatrics

## 2016-11-24 ENCOUNTER — Ambulatory Visit: Payer: Self-pay | Admitting: Pediatrics

## 2016-11-25 ENCOUNTER — Encounter: Payer: Self-pay | Admitting: Pediatrics

## 2016-11-26 ENCOUNTER — Ambulatory Visit: Payer: Self-pay | Admitting: Pediatrics

## 2016-11-28 ENCOUNTER — Encounter: Payer: Self-pay | Admitting: Pediatrics

## 2016-12-11 ENCOUNTER — Encounter: Payer: Self-pay | Admitting: Pediatrics

## 2016-12-11 ENCOUNTER — Encounter (INDEPENDENT_AMBULATORY_CARE_PROVIDER_SITE_OTHER): Payer: Self-pay

## 2016-12-11 ENCOUNTER — Ambulatory Visit (INDEPENDENT_AMBULATORY_CARE_PROVIDER_SITE_OTHER): Payer: Medicare HMO | Admitting: Pediatrics

## 2016-12-11 ENCOUNTER — Telehealth: Payer: Self-pay | Admitting: Pediatrics

## 2016-12-11 VITALS — BP 115/73 | HR 100 | Temp 97.7°F | Ht 63.0 in | Wt 187.0 lb

## 2016-12-11 DIAGNOSIS — N951 Menopausal and female climacteric states: Secondary | ICD-10-CM | POA: Diagnosis not present

## 2016-12-11 DIAGNOSIS — E119 Type 2 diabetes mellitus without complications: Secondary | ICD-10-CM

## 2016-12-11 DIAGNOSIS — R69 Illness, unspecified: Secondary | ICD-10-CM | POA: Diagnosis not present

## 2016-12-11 DIAGNOSIS — E785 Hyperlipidemia, unspecified: Secondary | ICD-10-CM

## 2016-12-11 DIAGNOSIS — E1169 Type 2 diabetes mellitus with other specified complication: Secondary | ICD-10-CM | POA: Diagnosis not present

## 2016-12-11 DIAGNOSIS — F411 Generalized anxiety disorder: Secondary | ICD-10-CM

## 2016-12-11 LAB — BAYER DCA HB A1C WAIVED: HB A1C: 6.9 % (ref <7.0)

## 2016-12-11 MED ORDER — PRAVASTATIN SODIUM 40 MG PO TABS: 40.0000 mg | ORAL_TABLET | Freq: Every day | ORAL | 3 refills | Status: DC

## 2016-12-11 MED ORDER — GLUCOSE BLOOD VI STRP: ORAL_STRIP | 12 refills | Status: AC

## 2016-12-11 MED ORDER — ALPRAZOLAM 0.5 MG PO TABS: 0.5000 mg | ORAL_TABLET | Freq: Every evening | ORAL | 1 refills | Status: DC | PRN

## 2016-12-11 MED ORDER — CITALOPRAM HYDROBROMIDE 40 MG PO TABS: 40.0000 mg | ORAL_TABLET | Freq: Every day | ORAL | 3 refills | Status: DC

## 2016-12-11 NOTE — Progress Notes (Signed)
  Subjective:   Patient ID: [name], female    DOB: [birth year], 56 y.o.   MRN: [id number] CC: Follow-up DM2 HPI: Rachael ImusWendell J Holmes is a 56 y.o. female presenting for Follow-up here today with husband  LMP about 1 year ago Having hot flashes every day Started on citalopram last visit, still taking 10mg  daily  HTN: BPs up to 154/93 at highest Lowest 127/72 No HA, no CP  DM2: says AM BGLs low 100s to 200 Sometimes gets steroid injections in back, she thinks BGLs go up then  Has been seen in ED with panic attacks she says in the past month Taking xanax as needed, not every day 60 tbs from 3,5 mo ago she has recently run out of  Following with ortho for back pain, neck pain Taking hydrocodone as prescribed by them per pt  Relevant past medical, surgical, family and social history reviewed. Allergies and medications reviewed and updated. History  Smoking Status  . Never Smoker  Smokeless Tobacco  . Never Used   ROS: Per HPI   Objective:    BP 115/73   Pulse 100   Temp 97.7 F (36.5 C) (Oral)   Ht 5\' 3"  (1.6 m)   Wt 187 lb (84.8 kg)   BMI 33.13 kg/m   Wt Readings from Last 3 Encounters:  12/11/16 187 lb (84.8 kg)  08/24/16 197 lb (89.4 kg)  10/29/15 192 lb 6.4 oz (87.3 kg)    Gen: NAD, alert, cooperative with exam, NCAT EYES: EOMI, no conjunctival injection, or no icterus ENT:  TMs dull gray b/l, OP without erythema LYMPH: no cervical LAD CV: NRRR, normal S1/S2, no murmur, distal pulses 2+ b/l Resp: CTABL, no wheezes, normal WOB Abd: +BS, soft, NTND.  Ext: No edema, warm Neuro: Alert and oriented Psych: normal affect  Assessment & Plan:  [name] was seen today for follow-up multiple med problems  Diagnoses and all orders for this visit:  Type 2 diabetes mellitus without complication, without long-term current use of insulin (HCC) A1c 6.9 Some lows at home down to 60s, symptomatic Will stop glimeperide Cont metformin -     Microalbumin /  creatinine urine ratio -     Bayer DCA Hb A1c Waived  Generalized anxiety disorder Increase celexa, take 20mg  x 2 weeks then increase to 40mg  Use below as needed 30 tabs with one refill given, must be seen for refills -     ALPRAZolam (XANAX) 0.5 MG tablet; Take 1 tablet (0.5 mg total) by mouth at bedtime as needed for anxiety.  Hot flashes, menopausal Cont below, increasing dose -     citalopram (CELEXA) 40 MG tablet; Take 1 tablet (40 mg total) by mouth daily.  Hyperlipidemia associated with type 2 diabetes mellitus (HCC) Having some muscle aches, will try switching to pravastatin -     pravastatin (PRAVACHOL) 40 MG tablet; Take 1 tablet (40 mg total) by mouth daily.   Follow up plan: Return in about 3 months (around 03/13/2017). Also needs to schedule AWV. Rex Krasarol Tavonte Seybold, MD [place] [facility]

## 2016-12-11 NOTE — Telephone Encounter (Signed)
Glucose strips sent to pharmacy

## 2016-12-12 ENCOUNTER — Other Ambulatory Visit: Payer: Self-pay | Admitting: Pediatrics

## 2016-12-12 DIAGNOSIS — E119 Type 2 diabetes mellitus without complications: Secondary | ICD-10-CM

## 2016-12-12 LAB — MICROALBUMIN / CREATININE URINE RATIO
Creatinine, Urine: 24.2 mg/dL
Microalb/Creat Ratio: 33.1 mg/g creat — ABNORMAL HIGH (ref 0.0–30.0)
Microalbumin, Urine: 8 ug/mL

## 2016-12-14 ENCOUNTER — Other Ambulatory Visit: Payer: Self-pay

## 2016-12-14 DIAGNOSIS — E119 Type 2 diabetes mellitus without complications: Secondary | ICD-10-CM

## 2016-12-14 DIAGNOSIS — N951 Menopausal and female climacteric states: Secondary | ICD-10-CM

## 2016-12-14 DIAGNOSIS — I10 Essential (primary) hypertension: Secondary | ICD-10-CM

## 2016-12-14 MED ORDER — LISINOPRIL-HYDROCHLOROTHIAZIDE 20-25 MG PO TABS: 1.0000 | ORAL_TABLET | Freq: Every day | ORAL | 1 refills | Status: DC

## 2016-12-14 MED ORDER — METFORMIN HCL 500 MG PO TABS: ORAL_TABLET | ORAL | 0 refills | Status: DC

## 2016-12-14 MED ORDER — LOVASTATIN 20 MG PO TABS: 20.0000 mg | ORAL_TABLET | Freq: Every day | ORAL | 1 refills | Status: DC

## 2016-12-14 MED ORDER — CITALOPRAM HYDROBROMIDE 40 MG PO TABS: 40.0000 mg | ORAL_TABLET | Freq: Every day | ORAL | 0 refills | Status: DC

## 2016-12-14 NOTE — Telephone Encounter (Signed)
[facility] in [place] sent for 90 day RX's  Sent Mevacor 20 which is not on patients med list?

## 2016-12-15 NOTE — Telephone Encounter (Signed)
TC to patient to make sure that she lets [facility] know that she is no longer taking the glimepiride so that they may take this off her list of medications

## 2017-01-04 ENCOUNTER — Other Ambulatory Visit (HOSPITAL_COMMUNITY): Payer: Self-pay | Admitting: Neurosurgery

## 2017-01-04 DIAGNOSIS — M5126 Other intervertebral disc displacement, lumbar region: Secondary | ICD-10-CM

## 2017-01-08 ENCOUNTER — Ambulatory Visit (HOSPITAL_COMMUNITY)
Admission: RE | Admit: 2017-01-08 | Discharge: 2017-01-08 | Disposition: A | Discharge status: Home / Self Care | Payer: Medicare HMO | Source: Ambulatory Visit | Attending: Neurosurgery | Admitting: Neurosurgery

## 2017-01-08 DIAGNOSIS — M545 Low back pain: Secondary | ICD-10-CM | POA: Diagnosis not present

## 2017-01-08 DIAGNOSIS — M5127 Other intervertebral disc displacement, lumbosacral region: Secondary | ICD-10-CM | POA: Diagnosis not present

## 2017-01-08 DIAGNOSIS — M5126 Other intervertebral disc displacement, lumbar region: Secondary | ICD-10-CM | POA: Diagnosis not present

## 2017-01-25 DIAGNOSIS — M5126 Other intervertebral disc displacement, lumbar region: Secondary | ICD-10-CM | POA: Diagnosis not present

## 2017-03-23 DIAGNOSIS — R918 Other nonspecific abnormal finding of lung field: Secondary | ICD-10-CM | POA: Diagnosis not present

## 2017-03-23 DIAGNOSIS — M5126 Other intervertebral disc displacement, lumbar region: Secondary | ICD-10-CM | POA: Diagnosis not present

## 2017-03-23 DIAGNOSIS — Z538 Procedure and treatment not carried out for other reasons: Secondary | ICD-10-CM | POA: Diagnosis not present

## 2017-03-24 ENCOUNTER — Other Ambulatory Visit: Payer: Self-pay

## 2017-03-24 ENCOUNTER — Other Ambulatory Visit: Payer: Self-pay | Admitting: Family Medicine

## 2017-03-24 ENCOUNTER — Telehealth: Payer: Self-pay

## 2017-03-24 DIAGNOSIS — R9431 Abnormal electrocardiogram [ECG] [EKG]: Secondary | ICD-10-CM

## 2017-03-24 NOTE — Telephone Encounter (Signed)
Patient of Dr Oswaldo Done. She is scheduled to have back surgery tomorrow. She went for preop today and her EKG showed some ischemia in 3 different leads. They postponed the surgery. Dr Channing Mutters would like for her to have a stress test but she is unable to walk on the treadmill so he wants her to have a chemical stress test. Can we order here or does she need to go to cardiology? Please advise

## 2017-03-24 NOTE — Telephone Encounter (Signed)
Urgent Cardiology referral placed to [facility]

## 2017-03-24 NOTE — Telephone Encounter (Signed)
Please refer to cardiology.

## 2017-04-11 ENCOUNTER — Other Ambulatory Visit: Payer: Self-pay | Admitting: Pediatrics

## 2017-04-11 DIAGNOSIS — I10 Essential (primary) hypertension: Secondary | ICD-10-CM

## 2017-04-12 ENCOUNTER — Encounter: Payer: Self-pay | Admitting: Pediatrics

## 2017-04-12 ENCOUNTER — Ambulatory Visit (INDEPENDENT_AMBULATORY_CARE_PROVIDER_SITE_OTHER): Payer: Medicare HMO | Admitting: Pediatrics

## 2017-04-12 VITALS — BP 132/84 | HR 99 | Temp 97.7°F | Ht 63.0 in | Wt 193.8 lb

## 2017-04-12 DIAGNOSIS — E785 Hyperlipidemia, unspecified: Secondary | ICD-10-CM | POA: Diagnosis not present

## 2017-04-12 DIAGNOSIS — R69 Illness, unspecified: Secondary | ICD-10-CM | POA: Diagnosis not present

## 2017-04-12 DIAGNOSIS — I10 Essential (primary) hypertension: Secondary | ICD-10-CM

## 2017-04-12 DIAGNOSIS — R9431 Abnormal electrocardiogram [ECG] [EKG]: Secondary | ICD-10-CM

## 2017-04-12 DIAGNOSIS — F411 Generalized anxiety disorder: Secondary | ICD-10-CM | POA: Diagnosis not present

## 2017-04-12 DIAGNOSIS — F419 Anxiety disorder, unspecified: Secondary | ICD-10-CM

## 2017-04-12 DIAGNOSIS — E119 Type 2 diabetes mellitus without complications: Secondary | ICD-10-CM

## 2017-04-12 DIAGNOSIS — Z23 Encounter for immunization: Secondary | ICD-10-CM | POA: Diagnosis not present

## 2017-04-12 LAB — BAYER DCA HB A1C WAIVED: HB A1C (BAYER DCA - WAIVED): 9.5 % — ABNORMAL HIGH (ref <7.0)

## 2017-04-12 MED ORDER — SITAGLIPTIN PHOSPHATE 100 MG PO TABS: 100.0000 mg | ORAL_TABLET | Freq: Every day | ORAL | 3 refills | Status: DC

## 2017-04-12 MED ORDER — GLUCOSE BLOOD VI STRP: ORAL_STRIP | 12 refills | Status: AC

## 2017-04-12 MED ORDER — ESCITALOPRAM OXALATE 10 MG PO TABS: 10.0000 mg | ORAL_TABLET | Freq: Every day | ORAL | 4 refills | Status: DC

## 2017-04-12 MED ORDER — ONETOUCH ULTRASOFT LANCETS MISC: 12 refills | Status: AC

## 2017-04-12 MED ORDER — ALPRAZOLAM 0.5 MG PO TABS: 0.5000 mg | ORAL_TABLET | Freq: Every evening | ORAL | 1 refills | Status: AC | PRN

## 2017-04-12 MED ORDER — ONETOUCH VERIO W/DEVICE KIT: 1.0000 | PACK | Freq: Three times a day (TID) | 0 refills | Status: AC

## 2017-04-12 NOTE — Patient Instructions (Signed)
Check blood sugars every morning Bring numbers to next clinic visit  Start januvia 100mg  in the morning for diabetes Avoid sugary foods, especially juice and regular sodas, sweet tea  Start lexapro half tab for 2 weeks, then start full tab

## 2017-04-12 NOTE — Progress Notes (Signed)
  Subjective:   Patient ID: [name], female    DOB: 29-May-1961, 56 y.o.   MRN: [id number] CC: Hyperglycemia  HPI: [name] is a 56 y.o. female presenting for Hyperglycemia  DM2: not on any medications now Checks BGLs at home  Back pain: had back surgery scheduled, was found to have abnormal EKG compared to prior at pre-op appt with anesthesiology, told she needed cardiology eval before surgery Here for f/u Has pain down R side of her leg  Abnormal EKG: no CP, not able to do much exercise due to back pain Able to sweep, mop, vacuum house without SOB or CP, has back pain but keeps ging through it Does get SOB when sick but not exertion  Anxiety: wakes up with anxiety Worrying about daughter, son  Relevant past medical, surgical, family and social history reviewed. Allergies and medications reviewed and updated. History  Smoking Status  . Never Smoker  Smokeless Tobacco  . Never Used   ROS: Per HPI   Objective:    BP 132/84   Pulse 99   Temp 97.7 F (36.5 C) (Oral)   Ht '5\' 3"'$  (1.6 m)   Wt 193 lb 12.8 oz (87.9 kg)   BMI 34.33 kg/m   Wt Readings from Last 3 Encounters:  04/12/17 193 lb 12.8 oz (87.9 kg)  12/11/16 187 lb (84.8 kg)  08/24/16 197 lb (89.4 kg)    Gen: NAD, alert, cooperative with exam, NCAT EYES: EOMI, no conjunctival injection, or no icterus ENT:  OP without erythema CV: NRRR, normal S1/S2, no murmur, distal pulses 2+ b/l Resp: CTABL, no wheezes, normal WOB Abd: +BS, soft, NTND.  Ext: No edema, warm Neuro: Alert and oriented Psych: no thoughts of self harm, normal affect  Assessment & Plan:  Truly was seen today for hyperglycemia.  Diagnoses and all orders for this visit:  Type 2 diabetes mellitus without complication, without long-term current use of insulin (HCC) A1c 9.5, off of all medicines now Metformin caused muscle cramps Start januvia Check BGls regularly, f/u 4 wks -     CMP14+EGFR -     Bayer DCA Hb A1c Waived -      sitaGLIPtin (JANUVIA) 100 MG tablet; Take 1 tablet (100 mg total) by mouth daily.  Hyperlipidemia, unspecified hyperlipidemia type On statin, cont -     CMP14+EGFR  Essential hypertension Adequate control, cont current meds -     CMP14+EGFR  Generalized anxiety disorder Ongoing symptoms Taking xanax apprx 3x a week Didn't start SSRI before Discussed starting lexapro, half tab for 2 weeks then full tab Take every day -     ALPRAZolam (XANAX) 0.5 MG tablet; Take 1 tablet (0.5 mg total) by mouth at bedtime as needed for anxiety.  Abnormal EKG -     Ambulatory referral to Cardiology  Follow up plan: Return in about 4 weeks (around 05/10/2017). [name], MD [place]

## 2017-04-13 ENCOUNTER — Telehealth: Payer: Self-pay | Admitting: *Deleted

## 2017-04-13 LAB — CMP14+EGFR
ALBUMIN: 4.2 g/dL (ref 3.5–5.5)
ALT: 36 IU/L — ABNORMAL HIGH (ref 0–32)
AST: 23 IU/L (ref 0–40)
Albumin/Globulin Ratio: 1.6 (ref 1.2–2.2)
Alkaline Phosphatase: 84 IU/L (ref 39–117)
BUN / CREAT RATIO: 13 (ref 9–23)
BUN: 11 mg/dL (ref 6–24)
Bilirubin Total: <0.2 mg/dL (ref 0.0–1.2)
CALCIUM: 9.4 mg/dL (ref 8.7–10.2)
CO2: 26 mmol/L (ref 20–29)
CREATININE: 0.84 mg/dL (ref 0.57–1.00)
Chloride: 97 mmol/L (ref 96–106)
GFR calc Af Amer: 91 mL/min/{1.73_m2} (ref >59)
GFR, EST NON AFRICAN AMERICAN: 79 mL/min/{1.73_m2} (ref >59)
GLOBULIN, TOTAL: 2.7 g/dL (ref 1.5–4.5)
Glucose: 440 mg/dL (ref 65–99)
Potassium: 3.9 mmol/L (ref 3.5–5.2)
SODIUM: 139 mmol/L (ref 134–144)
Total Protein: 6.9 g/dL (ref 6.0–8.5)

## 2017-04-13 MED ORDER — LOVASTATIN 20 MG PO TABS: 20.0000 mg | ORAL_TABLET | Freq: Every day | ORAL | 0 refills | Status: AC

## 2017-04-13 NOTE — Telephone Encounter (Signed)
Pt aware refill sent to pharmacy 

## 2017-04-14 ENCOUNTER — Telehealth: Payer: Self-pay

## 2017-04-14 NOTE — Telephone Encounter (Signed)
Patient is calling wanting lab results from 10/22- please review and advise

## 2017-04-14 NOTE — Telephone Encounter (Signed)
Called and d/w pt.

## 2017-04-15 ENCOUNTER — Telehealth: Payer: Self-pay | Admitting: Pediatrics

## 2017-04-15 NOTE — Telephone Encounter (Signed)
Please advise 

## 2017-04-16 MED ORDER — GLIMEPIRIDE 2 MG PO TABS: 2.0000 mg | ORAL_TABLET | Freq: Every day | ORAL | 1 refills | Status: DC

## 2017-04-16 NOTE — Telephone Encounter (Signed)
*  below. 

## 2017-04-16 NOTE — Telephone Encounter (Signed)
Please advise 

## 2017-04-16 NOTE — Telephone Encounter (Signed)
Tried to call back, LM, no answer I sent in glimeperide, I believe that is the medicine she is referring to She should keep taking BGLs every morning Let me know if < 100 or >200

## 2017-04-28 ENCOUNTER — Ambulatory Visit (INDEPENDENT_AMBULATORY_CARE_PROVIDER_SITE_OTHER): Payer: Medicare HMO | Admitting: Cardiology

## 2017-04-28 ENCOUNTER — Encounter: Payer: Self-pay | Admitting: Cardiology

## 2017-04-28 VITALS — BP 115/80 | HR 88 | Ht 63.0 in | Wt 189.0 lb

## 2017-04-28 DIAGNOSIS — Z01810 Encounter for preprocedural cardiovascular examination: Secondary | ICD-10-CM

## 2017-04-28 DIAGNOSIS — R9431 Abnormal electrocardiogram [ECG] [EKG]: Secondary | ICD-10-CM | POA: Diagnosis not present

## 2017-04-28 NOTE — Progress Notes (Addendum)
Clinical Summary Ms. Boehm is a 56 y.o.female seen as new consult, referred by Dr Evette Doffing for abnormal EKG.   1. Abnormal EKG - no prior cardiac history - no chest pain. No SOB/DOE. Walks regularly every night, walks up 2 hours without troubles - noted at preop evaluation prior to back surgery to have abnormal EKG. TWI inversions inferior leads.  CAD risk factors: DM2, HL, HTN, former smoker.   2. Preoperative evaluation - being considered for back surgery Past Medical History:  Diagnosis Date  . Anxiety   . Arthritis   . Asthma   . Diabetes mellitus   . Hypertension   . Polycystic ovarian syndrome      Allergies  Allergen Reactions  . Penicillins Nausea Only    Has patient had a PCN reaction causing immediate rash, facial/tongue/throat swelling, SOB or lightheadedness with hypotension: No Has patient had a PCN reaction causing severe rash involving mucus membranes or skin necrosis: No Has patient had a PCN reaction that required hospitalization No Has patient had a PCN reaction occurring within the last 10 years: No If all of the above answers are "NO", then may proceed with Cephalosporin use.      Current Outpatient Medications  Medication Sig Dispense Refill  . albuterol (PROVENTIL) (2.5 MG/3ML) 0.083% nebulizer solution Take 2.5 mg by nebulization every 6 (six) hours as needed.    . ALPRAZolam (XANAX) 0.5 MG tablet Take 1 tablet (0.5 mg total) by mouth at bedtime as needed for anxiety. 30 tablet 1  . Blood Glucose Monitoring Suppl (ONETOUCH VERIO) w/Device KIT 1 kit by Does not apply route 3 (three) times daily. 1 kit 0  . escitalopram (LEXAPRO) 10 MG tablet Take 1 tablet (10 mg total) by mouth daily. Take half tab for two weeks then full tab 30 tablet 4  . glimepiride (AMARYL) 2 MG tablet Take 1 tablet (2 mg total) by mouth daily with breakfast. 30 tablet 1  . glucose blood (ONETOUCH VERIO) test strip Use as instructed 100 each 12  . glucose blood test strip  Use to check blood sugars up to two times a day 100 each 12  . HYDROcodone-acetaminophen (NORCO) 10-325 MG tablet Take 1 tablet by mouth every 6 (six) hours as needed.    . Lancets (ONETOUCH ULTRASOFT) lancets Use as instructed 100 each 12  . lisinopril-hydrochlorothiazide (PRINZIDE,ZESTORETIC) 20-25 MG tablet TAKE 1 TABLET BY MOUTH ONCE DAILY 90 tablet 0  . lovastatin (MEVACOR) 20 MG tablet Take 1 tablet (20 mg total) by mouth at bedtime. 90 tablet 0  . ondansetron (ZOFRAN ODT) 4 MG disintegrating tablet 38m ODT q4 hours prn nausea/vomit 12 tablet 0  . sitaGLIPtin (JANUVIA) 100 MG tablet Take 1 tablet (100 mg total) by mouth daily. 30 tablet 3   No current facility-administered medications for this visit.      Past Surgical History:  Procedure Laterality Date  . CERVICAL FUSION       Allergies  Allergen Reactions  . Penicillins Nausea Only    Has patient had a PCN reaction causing immediate rash, facial/tongue/throat swelling, SOB or lightheadedness with hypotension: No Has patient had a PCN reaction causing severe rash involving mucus membranes or skin necrosis: No Has patient had a PCN reaction that required hospitalization No Has patient had a PCN reaction occurring within the last 10 years: No If all of the above answers are "NO", then may proceed with Cephalosporin use.       No family history on  file.   Social History Ms. Blahnik reports that  has never smoked. she has never used smokeless tobacco. Ms. Boeve reports that she does not drink alcohol.   Review of Systems CONSTITUTIONAL: No weight loss, fever, chills, weakness or fatigue.  HEENT: Eyes: No visual loss, blurred vision, double vision or yellow sclerae.No hearing loss, sneezing, congestion, runny nose or sore throat.  SKIN: No rash or itching.  CARDIOVASCULAR: per hpi RESPIRATORY: No shortness of breath, cough or sputum.  GASTROINTESTINAL: No anorexia, nausea, vomiting or diarrhea. No abdominal pain or  blood.  GENITOURINARY: No burning on urination, no polyuria NEUROLOGICAL: No headache, dizziness, syncope, paralysis, ataxia, numbness or tingling in the extremities. No change in bowel or bladder control.  MUSCULOSKELETAL: No muscle, back pain, joint pain or stiffness.  LYMPHATICS: No enlarged nodes. No history of splenectomy.  PSYCHIATRIC: No history of depression or anxiety.  ENDOCRINOLOGIC: No reports of sweating, cold or heat intolerance. No polyuria or polydipsia.  [name]   Physical Examination Vitals:   04/28/17 0841 04/28/17 0847  BP: 122/85 115/80  Pulse: 88   SpO2: 97%    Vitals:   04/28/17 0841  Weight: 189 lb (85.7 kg)  Height: _0  (1.6 m)    Gen: resting comfortably, no acute distress HEENT: no scleral icterus, pupils equal round and reactive, no palptable cervical adenopathy,  CV Resp: Clear to auscultation bilaterally GI: abdomen is soft, non-tender, non-distended, normal bowel sounds, no hepatosplenomegaly MSK: extremities are warm, no edema.  Skin: warm, no rash Neuro:  no focal deficits Psych: appropriate affect    Assessment and Plan  1. Abnormal EKG - preop EKG made available after our appoinment, there is evidence of inferior T-wave inversions. Interstingly these are present today, suggesting dynamic ST/T changes - she has multiple CAD risk factors, though she has not been symptomatic. Noted her exertion is limited due to chronic back pain - we will plan for a lexiscan MPI to further evaluate her abnormal EKG, and also risk stratify prior to possible back surgery     Arnoldo Lenis, M.D.   06/07/17 Addendum Follow up testing with echo and nuclear stress test have been benign, no evidence of underlying cardiac disease. Recommend proceeding with surgery as planned.  Carlyle Dolly MD

## 2017-04-28 NOTE — Patient Instructions (Signed)
Medication Instructions:  Your physician recommends that you continue on your current medications as directed. Please refer to the Current Medication list given to you today.   Labwork: NONE  Testing/Procedures: NONE  Follow-Up: Your physician recommends that you schedule a follow-up appointment in: PENDING RESULTS OF EKG    Any Other Special Instructions Will Be Listed Below (If Applicable).     If you need a refill on your cardiac medications before your next appointment, please call your pharmacy.

## 2017-04-29 NOTE — Telephone Encounter (Signed)
Multiple attempts made to contact patient.  Patient has an appt next week.  This encounter will now be closed.

## 2017-04-30 ENCOUNTER — Encounter: Payer: Self-pay | Admitting: Cardiology

## 2017-05-05 ENCOUNTER — Ambulatory Visit: Payer: Medicare HMO | Admitting: Pediatrics

## 2017-05-06 ENCOUNTER — Encounter (HOSPITAL_COMMUNITY): Payer: Medicare Other

## 2017-05-06 ENCOUNTER — Ambulatory Visit (HOSPITAL_COMMUNITY): Payer: Medicare HMO

## 2017-05-11 DIAGNOSIS — M5126 Other intervertebral disc displacement, lumbar region: Secondary | ICD-10-CM | POA: Diagnosis not present

## 2017-05-17 ENCOUNTER — Ambulatory Visit: Payer: Medicare HMO | Admitting: Pediatrics

## 2017-05-18 ENCOUNTER — Ambulatory Visit (HOSPITAL_COMMUNITY)
Admission: RE | Admit: 2017-05-18 | Discharge: 2017-05-18 | Disposition: A | Discharge status: Home / Self Care | Payer: Medicare HMO | Source: Ambulatory Visit | Attending: Cardiology | Admitting: Cardiology

## 2017-05-18 ENCOUNTER — Encounter (HOSPITAL_COMMUNITY)
Admission: RE | Admit: 2017-05-18 | Discharge: 2017-05-18 | Disposition: A | Discharge status: Home / Self Care | Payer: Medicare HMO | Source: Ambulatory Visit | Attending: Cardiology | Admitting: Cardiology

## 2017-05-18 DIAGNOSIS — R9431 Abnormal electrocardiogram [ECG] [EKG]: Secondary | ICD-10-CM | POA: Diagnosis not present

## 2017-05-18 LAB — NM MYOCAR MULTI W/SPECT W/WALL MOTION / EF
CHL CUP NUCLEAR SRS: 0
CHL CUP RESTING HR STRESS: 75 {beats}/min
CSEPPHR: 117 {beats}/min
LVDIAVOL: 64 mL (ref 46–106)
LVSYSVOL: 34 mL
NUC STRESS TID: 0.83
RATE: 0.59
SDS: 0
SSS: 0

## 2017-05-18 MED ORDER — TECHNETIUM TC 99M TETROFOSMIN IV KIT
10.0000 | PACK | Freq: Once | INTRAVENOUS | Status: AC | PRN
  Administered 2017-05-18: 10 via INTRAVENOUS

## 2017-05-18 MED ORDER — TECHNETIUM TC 99M TETROFOSMIN IV KIT
30.0000 | PACK | Freq: Once | INTRAVENOUS | Status: AC | PRN
  Administered 2017-05-18: 30 via INTRAVENOUS

## 2017-05-18 MED ORDER — SODIUM CHLORIDE 0.9% FLUSH
INTRAVENOUS | Status: AC
  Administered 2017-05-18: 10 mL via INTRAVENOUS
  Filled 2017-05-18: qty 10

## 2017-05-18 MED ORDER — REGADENOSON 0.4 MG/5ML IV SOLN
INTRAVENOUS | Status: AC
  Administered 2017-05-18: 0.4 mg via INTRAVENOUS
  Filled 2017-05-18: qty 5

## 2017-05-19 ENCOUNTER — Encounter: Payer: Self-pay | Admitting: Pediatrics

## 2017-05-20 ENCOUNTER — Telehealth: Payer: Self-pay

## 2017-05-20 DIAGNOSIS — R9431 Abnormal electrocardiogram [ECG] [EKG]: Secondary | ICD-10-CM

## 2017-05-20 NOTE — Telephone Encounter (Signed)
-----   Message from Antoine PocheJonathan F Branch, MD sent at 05/20/2017  9:52 AM EST ----- Stress test overall looks good, there is no evidence of any signficant blockages. The test suggests that the pumping function of her heart may be a little decreased, please order an echo to get further info for abnormal EKG. Surgical clerance pending echo results  Dominga FerryJ Branch MD

## 2017-05-20 NOTE — Telephone Encounter (Signed)
Called pt no answer. Left message for her to return call. Echo order put in.

## 2017-05-21 ENCOUNTER — Telehealth: Payer: Self-pay | Admitting: *Deleted

## 2017-05-21 NOTE — Telephone Encounter (Signed)
Called patient with test results. No answer. Unable to leave msg.  

## 2017-05-21 NOTE — Telephone Encounter (Signed)
-----   Message from Jonathan F Branch, MD sent at 05/20/2017  9:52 AM EST ----- Stress test overall looks good, there is no evidence of any signficant blockages. The test suggests that the pumping function of her heart may be a little decreased, please order an echo to get further info for abnormal EKG. Surgical clerance pending echo results  J Branch MD 

## 2017-06-03 ENCOUNTER — Ambulatory Visit (HOSPITAL_COMMUNITY)
Admission: RE | Admit: 2017-06-03 | Discharge: 2017-06-03 | Disposition: A | Discharge status: Home / Self Care | Payer: Medicare HMO | Source: Ambulatory Visit | Attending: Cardiology | Admitting: Cardiology

## 2017-06-03 DIAGNOSIS — I371 Nonrheumatic pulmonary valve insufficiency: Secondary | ICD-10-CM | POA: Diagnosis not present

## 2017-06-03 DIAGNOSIS — R9431 Abnormal electrocardiogram [ECG] [EKG]: Secondary | ICD-10-CM | POA: Insufficient documentation

## 2017-06-03 LAB — ECHOCARDIOGRAM COMPLETE
CHL CUP MV DEC (S): 190
E/e' ratio: 8.12
EWDT: 190 ms
FS: 33 % (ref 28–44)
IVS/LV PW RATIO, ED: 1.09
LA diam end sys: 35 mm
LA diam index: 1.76 cm/m2
LA vol A4C: 19.5 ml
LA vol index: 9.5 mL/m2
LA vol: 18.9 mL
LASIZE: 35 mm
LV E/e' medial: 8.12
LV TDI E'MEDIAL: 6.42
LV dias vol index: 29 mL/m2
LV dias vol: 57 mL (ref 46–106)
LV e' LATERAL: 7.51 cm/s
LV sys vol: 21 mL (ref 14–42)
LVEEAVG: 8.12
LVOT VTI: 20.8 cm
LVOT area: 2.84 cm2
LVOT diameter: 19 mm
LVOT peak grad rest: 5 mmHg
LVOTPV: 108 cm/s
LVOTSV: 59 mL
LVSYSVOLIN: 10 mL/m2
MV pk A vel: 84.3 m/s
MVPKEVEL: 61 m/s
PW: 9.89 mm — AB (ref 0.6–1.1)
RV LATERAL S' VELOCITY: 17.2 cm/s
RV TAPSE: 14.8 mm
Simpson's disk: 64
Stroke v: 37 ml
TDI e' lateral: 7.51

## 2017-06-03 NOTE — Progress Notes (Signed)
*  PRELIMINARY RESULTS* Echocardiogram 2D Echocardiogram has been performed.  Stacey DrainWhite, Yaslin Kirtley J 06/03/2017, 3:43 PM

## 2017-06-07 ENCOUNTER — Telehealth: Payer: Self-pay

## 2017-06-07 NOTE — Telephone Encounter (Signed)
-----   Message from Antoine PocheJonathan F Branch, MD sent at 06/07/2017 11:04 AM EST ----- Echo looks good, overrall her heart testing has looked good and reassuring. She is ok to proceed with her surgery. Please send a copy of my addended clinic note to her surgeon   Dominga FerryJ Branch MD

## 2017-06-07 NOTE — Telephone Encounter (Signed)
Pt  notified , will forward note to Dr Channing Muttersoy

## 2017-06-07 NOTE — Telephone Encounter (Signed)
-----   Message from Jonathan F Branch, MD sent at 06/07/2017 11:04 AM EST ----- Echo looks good, overrall her heart testing has looked good and reassuring. She is ok to proceed with her surgery. Please send a copy of my addended clinic note to her surgeon   J Branch MD 

## 2017-06-24 DIAGNOSIS — M5126 Other intervertebral disc displacement, lumbar region: Secondary | ICD-10-CM | POA: Diagnosis not present

## 2017-07-29 DIAGNOSIS — Z6835 Body mass index (BMI) 35.0-35.9, adult: Secondary | ICD-10-CM | POA: Diagnosis not present

## 2017-07-29 DIAGNOSIS — R69 Illness, unspecified: Secondary | ICD-10-CM | POA: Diagnosis not present

## 2017-07-29 DIAGNOSIS — E1165 Type 2 diabetes mellitus with hyperglycemia: Secondary | ICD-10-CM | POA: Diagnosis not present

## 2017-07-29 DIAGNOSIS — I10 Essential (primary) hypertension: Secondary | ICD-10-CM | POA: Diagnosis not present

## 2017-07-29 DIAGNOSIS — M545 Low back pain: Secondary | ICD-10-CM | POA: Diagnosis not present

## 2017-07-29 DIAGNOSIS — M542 Cervicalgia: Secondary | ICD-10-CM | POA: Diagnosis not present

## 2017-08-12 DIAGNOSIS — M545 Low back pain: Secondary | ICD-10-CM | POA: Diagnosis not present

## 2017-08-12 DIAGNOSIS — E119 Type 2 diabetes mellitus without complications: Secondary | ICD-10-CM | POA: Diagnosis not present

## 2017-08-12 DIAGNOSIS — I10 Essential (primary) hypertension: Secondary | ICD-10-CM | POA: Diagnosis not present

## 2017-08-12 DIAGNOSIS — R69 Illness, unspecified: Secondary | ICD-10-CM | POA: Diagnosis not present

## 2017-08-12 DIAGNOSIS — Z0000 Encounter for general adult medical examination without abnormal findings: Secondary | ICD-10-CM | POA: Diagnosis not present

## 2017-08-16 DIAGNOSIS — I10 Essential (primary) hypertension: Secondary | ICD-10-CM | POA: Diagnosis not present

## 2017-08-16 DIAGNOSIS — E119 Type 2 diabetes mellitus without complications: Secondary | ICD-10-CM | POA: Diagnosis not present

## 2017-08-16 DIAGNOSIS — R69 Illness, unspecified: Secondary | ICD-10-CM | POA: Diagnosis not present

## 2017-08-16 DIAGNOSIS — M545 Low back pain: Secondary | ICD-10-CM | POA: Diagnosis not present

## 2017-08-16 DIAGNOSIS — Z0001 Encounter for general adult medical examination with abnormal findings: Secondary | ICD-10-CM | POA: Diagnosis not present

## 2017-08-30 DIAGNOSIS — Z6835 Body mass index (BMI) 35.0-35.9, adult: Secondary | ICD-10-CM | POA: Diagnosis not present

## 2017-08-30 DIAGNOSIS — I10 Essential (primary) hypertension: Secondary | ICD-10-CM | POA: Diagnosis not present

## 2017-09-29 DIAGNOSIS — M5126 Other intervertebral disc displacement, lumbar region: Secondary | ICD-10-CM | POA: Diagnosis not present

## 2017-10-25 DIAGNOSIS — J449 Chronic obstructive pulmonary disease, unspecified: Secondary | ICD-10-CM | POA: Diagnosis not present

## 2017-10-25 DIAGNOSIS — M48061 Spinal stenosis, lumbar region without neurogenic claudication: Secondary | ICD-10-CM | POA: Diagnosis not present

## 2017-10-25 DIAGNOSIS — E119 Type 2 diabetes mellitus without complications: Secondary | ICD-10-CM | POA: Diagnosis not present

## 2017-10-25 DIAGNOSIS — Z79899 Other long term (current) drug therapy: Secondary | ICD-10-CM | POA: Diagnosis not present

## 2017-10-25 DIAGNOSIS — R69 Illness, unspecified: Secondary | ICD-10-CM | POA: Diagnosis not present

## 2017-10-25 DIAGNOSIS — M1990 Unspecified osteoarthritis, unspecified site: Secondary | ICD-10-CM | POA: Diagnosis not present

## 2017-10-25 DIAGNOSIS — I10 Essential (primary) hypertension: Secondary | ICD-10-CM | POA: Diagnosis not present

## 2017-10-25 DIAGNOSIS — M5126 Other intervertebral disc displacement, lumbar region: Secondary | ICD-10-CM | POA: Diagnosis not present

## 2017-10-25 DIAGNOSIS — E785 Hyperlipidemia, unspecified: Secondary | ICD-10-CM | POA: Diagnosis not present

## 2017-10-25 DIAGNOSIS — F419 Anxiety disorder, unspecified: Secondary | ICD-10-CM | POA: Diagnosis not present

## 2017-10-25 DIAGNOSIS — K219 Gastro-esophageal reflux disease without esophagitis: Secondary | ICD-10-CM | POA: Diagnosis not present

## 2017-10-25 DIAGNOSIS — Z981 Arthrodesis status: Secondary | ICD-10-CM | POA: Diagnosis not present

## 2017-12-03 DIAGNOSIS — E782 Mixed hyperlipidemia: Secondary | ICD-10-CM | POA: Diagnosis not present

## 2017-12-03 DIAGNOSIS — E1165 Type 2 diabetes mellitus with hyperglycemia: Secondary | ICD-10-CM | POA: Diagnosis not present

## 2018-01-19 DIAGNOSIS — M545 Low back pain: Secondary | ICD-10-CM | POA: Diagnosis not present

## 2018-01-19 DIAGNOSIS — E1165 Type 2 diabetes mellitus with hyperglycemia: Secondary | ICD-10-CM | POA: Diagnosis not present

## 2018-01-19 DIAGNOSIS — R69 Illness, unspecified: Secondary | ICD-10-CM | POA: Diagnosis not present

## 2018-01-19 DIAGNOSIS — Z6834 Body mass index (BMI) 34.0-34.9, adult: Secondary | ICD-10-CM | POA: Diagnosis not present

## 2018-01-19 DIAGNOSIS — I10 Essential (primary) hypertension: Secondary | ICD-10-CM | POA: Diagnosis not present

## 2018-01-19 DIAGNOSIS — E782 Mixed hyperlipidemia: Secondary | ICD-10-CM | POA: Diagnosis not present

## 2018-03-01 DIAGNOSIS — R69 Illness, unspecified: Secondary | ICD-10-CM | POA: Diagnosis not present

## 2018-03-01 DIAGNOSIS — M5126 Other intervertebral disc displacement, lumbar region: Secondary | ICD-10-CM | POA: Diagnosis not present

## 2018-05-10 DIAGNOSIS — J019 Acute sinusitis, unspecified: Secondary | ICD-10-CM | POA: Diagnosis not present

## 2018-05-24 DIAGNOSIS — M5126 Other intervertebral disc displacement, lumbar region: Secondary | ICD-10-CM | POA: Diagnosis not present

## 2018-06-22 HISTORY — PX: CHOLECYSTECTOMY: SHX55

## 2018-07-05 DIAGNOSIS — M5126 Other intervertebral disc displacement, lumbar region: Secondary | ICD-10-CM | POA: Diagnosis not present

## 2018-07-06 ENCOUNTER — Other Ambulatory Visit: Payer: Self-pay | Admitting: Neurosurgery

## 2018-07-06 DIAGNOSIS — M5126 Other intervertebral disc displacement, lumbar region: Secondary | ICD-10-CM

## 2018-07-12 DIAGNOSIS — R0981 Nasal congestion: Secondary | ICD-10-CM | POA: Diagnosis not present

## 2018-07-12 DIAGNOSIS — R05 Cough: Secondary | ICD-10-CM | POA: Diagnosis not present

## 2018-07-12 DIAGNOSIS — J019 Acute sinusitis, unspecified: Secondary | ICD-10-CM | POA: Diagnosis not present

## 2018-07-12 DIAGNOSIS — H9209 Otalgia, unspecified ear: Secondary | ICD-10-CM | POA: Diagnosis not present

## 2018-08-17 ENCOUNTER — Other Ambulatory Visit: Payer: Self-pay | Admitting: Neurosurgery

## 2018-08-17 ENCOUNTER — Ambulatory Visit
Admission: RE | Admit: 2018-08-17 | Discharge: 2018-08-17 | Disposition: A | Discharge status: Home / Self Care | Payer: Medicare HMO | Source: Ambulatory Visit | Attending: Neurosurgery | Admitting: Neurosurgery

## 2018-08-17 DIAGNOSIS — M545 Low back pain: Secondary | ICD-10-CM | POA: Diagnosis not present

## 2018-08-17 DIAGNOSIS — M5126 Other intervertebral disc displacement, lumbar region: Secondary | ICD-10-CM

## 2018-08-18 DIAGNOSIS — F419 Anxiety disorder, unspecified: Secondary | ICD-10-CM | POA: Diagnosis not present

## 2018-08-18 DIAGNOSIS — E1169 Type 2 diabetes mellitus with other specified complication: Secondary | ICD-10-CM | POA: Diagnosis not present

## 2018-08-18 DIAGNOSIS — I10 Essential (primary) hypertension: Secondary | ICD-10-CM | POA: Diagnosis not present

## 2018-08-18 DIAGNOSIS — E119 Type 2 diabetes mellitus without complications: Secondary | ICD-10-CM | POA: Diagnosis not present

## 2018-08-18 DIAGNOSIS — M542 Cervicalgia: Secondary | ICD-10-CM | POA: Diagnosis not present

## 2018-08-18 DIAGNOSIS — J019 Acute sinusitis, unspecified: Secondary | ICD-10-CM | POA: Diagnosis not present

## 2018-08-18 DIAGNOSIS — R69 Illness, unspecified: Secondary | ICD-10-CM | POA: Diagnosis not present

## 2018-08-18 DIAGNOSIS — F411 Generalized anxiety disorder: Secondary | ICD-10-CM | POA: Diagnosis not present

## 2018-08-18 DIAGNOSIS — M5126 Other intervertebral disc displacement, lumbar region: Secondary | ICD-10-CM | POA: Diagnosis not present

## 2018-08-18 DIAGNOSIS — K529 Noninfective gastroenteritis and colitis, unspecified: Secondary | ICD-10-CM | POA: Diagnosis not present

## 2018-08-18 DIAGNOSIS — E1165 Type 2 diabetes mellitus with hyperglycemia: Secondary | ICD-10-CM | POA: Diagnosis not present

## 2018-08-18 DIAGNOSIS — R5383 Other fatigue: Secondary | ICD-10-CM | POA: Diagnosis not present

## 2018-08-18 DIAGNOSIS — R000 Tachycardia, unspecified: Secondary | ICD-10-CM | POA: Diagnosis not present

## 2018-08-18 DIAGNOSIS — Z0001 Encounter for general adult medical examination with abnormal findings: Secondary | ICD-10-CM | POA: Diagnosis not present

## 2018-08-18 DIAGNOSIS — M545 Low back pain: Secondary | ICD-10-CM | POA: Diagnosis not present

## 2018-08-18 DIAGNOSIS — R112 Nausea with vomiting, unspecified: Secondary | ICD-10-CM | POA: Diagnosis not present

## 2018-08-18 DIAGNOSIS — R197 Diarrhea, unspecified: Secondary | ICD-10-CM | POA: Diagnosis not present

## 2018-09-22 DIAGNOSIS — M545 Low back pain: Secondary | ICD-10-CM | POA: Diagnosis not present

## 2018-09-22 DIAGNOSIS — E1169 Type 2 diabetes mellitus with other specified complication: Secondary | ICD-10-CM | POA: Diagnosis not present

## 2018-09-22 DIAGNOSIS — I10 Essential (primary) hypertension: Secondary | ICD-10-CM | POA: Diagnosis not present

## 2018-09-22 DIAGNOSIS — E785 Hyperlipidemia, unspecified: Secondary | ICD-10-CM | POA: Diagnosis not present

## 2019-01-12 DIAGNOSIS — E1165 Type 2 diabetes mellitus with hyperglycemia: Secondary | ICD-10-CM | POA: Diagnosis not present

## 2019-01-12 DIAGNOSIS — I10 Essential (primary) hypertension: Secondary | ICD-10-CM | POA: Diagnosis not present

## 2019-01-12 DIAGNOSIS — E119 Type 2 diabetes mellitus without complications: Secondary | ICD-10-CM | POA: Diagnosis not present

## 2019-01-12 DIAGNOSIS — E782 Mixed hyperlipidemia: Secondary | ICD-10-CM | POA: Diagnosis not present

## 2019-01-12 DIAGNOSIS — E1169 Type 2 diabetes mellitus with other specified complication: Secondary | ICD-10-CM | POA: Diagnosis not present

## 2019-01-12 DIAGNOSIS — E785 Hyperlipidemia, unspecified: Secondary | ICD-10-CM | POA: Diagnosis not present

## 2019-01-17 DIAGNOSIS — E1165 Type 2 diabetes mellitus with hyperglycemia: Secondary | ICD-10-CM | POA: Diagnosis not present

## 2019-01-17 DIAGNOSIS — E782 Mixed hyperlipidemia: Secondary | ICD-10-CM | POA: Diagnosis not present

## 2019-01-17 DIAGNOSIS — G894 Chronic pain syndrome: Secondary | ICD-10-CM | POA: Diagnosis not present

## 2019-01-17 DIAGNOSIS — Z6834 Body mass index (BMI) 34.0-34.9, adult: Secondary | ICD-10-CM | POA: Diagnosis not present

## 2019-01-17 DIAGNOSIS — R944 Abnormal results of kidney function studies: Secondary | ICD-10-CM | POA: Diagnosis not present

## 2019-01-17 DIAGNOSIS — M545 Low back pain: Secondary | ICD-10-CM | POA: Diagnosis not present

## 2019-01-17 DIAGNOSIS — R69 Illness, unspecified: Secondary | ICD-10-CM | POA: Diagnosis not present

## 2019-01-17 DIAGNOSIS — N183 Chronic kidney disease, stage 3 (moderate): Secondary | ICD-10-CM | POA: Diagnosis not present

## 2019-01-17 DIAGNOSIS — E1122 Type 2 diabetes mellitus with diabetic chronic kidney disease: Secondary | ICD-10-CM | POA: Diagnosis not present

## 2019-01-17 DIAGNOSIS — I10 Essential (primary) hypertension: Secondary | ICD-10-CM | POA: Diagnosis not present

## 2019-04-18 DIAGNOSIS — R69 Illness, unspecified: Secondary | ICD-10-CM | POA: Diagnosis not present

## 2019-04-18 DIAGNOSIS — R42 Dizziness and giddiness: Secondary | ICD-10-CM | POA: Diagnosis not present

## 2019-04-18 DIAGNOSIS — I10 Essential (primary) hypertension: Secondary | ICD-10-CM | POA: Diagnosis not present

## 2019-04-18 DIAGNOSIS — M1990 Unspecified osteoarthritis, unspecified site: Secondary | ICD-10-CM | POA: Diagnosis not present

## 2019-04-18 DIAGNOSIS — Z87891 Personal history of nicotine dependence: Secondary | ICD-10-CM | POA: Diagnosis not present

## 2019-04-18 DIAGNOSIS — E119 Type 2 diabetes mellitus without complications: Secondary | ICD-10-CM | POA: Diagnosis not present

## 2019-04-18 DIAGNOSIS — Z7984 Long term (current) use of oral hypoglycemic drugs: Secondary | ICD-10-CM | POA: Diagnosis not present

## 2019-04-18 DIAGNOSIS — J449 Chronic obstructive pulmonary disease, unspecified: Secondary | ICD-10-CM | POA: Diagnosis not present

## 2019-04-21 DIAGNOSIS — M542 Cervicalgia: Secondary | ICD-10-CM | POA: Diagnosis not present

## 2019-04-21 DIAGNOSIS — E1165 Type 2 diabetes mellitus with hyperglycemia: Secondary | ICD-10-CM | POA: Diagnosis not present

## 2019-04-21 DIAGNOSIS — R944 Abnormal results of kidney function studies: Secondary | ICD-10-CM | POA: Diagnosis not present

## 2019-04-21 DIAGNOSIS — R69 Illness, unspecified: Secondary | ICD-10-CM | POA: Diagnosis not present

## 2019-04-21 DIAGNOSIS — Z712 Person consulting for explanation of examination or test findings: Secondary | ICD-10-CM | POA: Diagnosis not present

## 2019-04-21 DIAGNOSIS — F419 Anxiety disorder, unspecified: Secondary | ICD-10-CM | POA: Diagnosis not present

## 2019-04-21 DIAGNOSIS — G894 Chronic pain syndrome: Secondary | ICD-10-CM | POA: Diagnosis not present

## 2019-04-21 DIAGNOSIS — E782 Mixed hyperlipidemia: Secondary | ICD-10-CM | POA: Diagnosis not present

## 2019-04-21 DIAGNOSIS — N183 Chronic kidney disease, stage 3 unspecified: Secondary | ICD-10-CM | POA: Diagnosis not present

## 2019-04-21 DIAGNOSIS — Z6835 Body mass index (BMI) 35.0-35.9, adult: Secondary | ICD-10-CM | POA: Diagnosis not present

## 2019-04-27 DIAGNOSIS — E1122 Type 2 diabetes mellitus with diabetic chronic kidney disease: Secondary | ICD-10-CM | POA: Diagnosis not present

## 2019-04-27 DIAGNOSIS — M545 Low back pain: Secondary | ICD-10-CM | POA: Diagnosis not present

## 2019-04-27 DIAGNOSIS — E782 Mixed hyperlipidemia: Secondary | ICD-10-CM | POA: Diagnosis not present

## 2019-04-27 DIAGNOSIS — G894 Chronic pain syndrome: Secondary | ICD-10-CM | POA: Diagnosis not present

## 2019-04-27 DIAGNOSIS — I10 Essential (primary) hypertension: Secondary | ICD-10-CM | POA: Diagnosis not present

## 2019-04-27 DIAGNOSIS — R69 Illness, unspecified: Secondary | ICD-10-CM | POA: Diagnosis not present

## 2019-04-27 DIAGNOSIS — R944 Abnormal results of kidney function studies: Secondary | ICD-10-CM | POA: Diagnosis not present

## 2019-04-27 DIAGNOSIS — Z23 Encounter for immunization: Secondary | ICD-10-CM | POA: Diagnosis not present

## 2019-04-27 DIAGNOSIS — N1831 Chronic kidney disease, stage 3a: Secondary | ICD-10-CM | POA: Diagnosis not present

## 2019-04-27 DIAGNOSIS — Z0000 Encounter for general adult medical examination without abnormal findings: Secondary | ICD-10-CM | POA: Diagnosis not present

## 2019-05-22 DIAGNOSIS — R05 Cough: Secondary | ICD-10-CM | POA: Diagnosis not present

## 2019-05-22 DIAGNOSIS — R0981 Nasal congestion: Secondary | ICD-10-CM | POA: Diagnosis not present

## 2019-06-20 DIAGNOSIS — R69 Illness, unspecified: Secondary | ICD-10-CM | POA: Diagnosis not present

## 2019-06-20 DIAGNOSIS — M961 Postlaminectomy syndrome, not elsewhere classified: Secondary | ICD-10-CM | POA: Diagnosis not present

## 2019-06-20 DIAGNOSIS — M5416 Radiculopathy, lumbar region: Secondary | ICD-10-CM | POA: Diagnosis not present

## 2019-06-20 DIAGNOSIS — E119 Type 2 diabetes mellitus without complications: Secondary | ICD-10-CM | POA: Diagnosis not present

## 2019-06-20 DIAGNOSIS — F410 Panic disorder [episodic paroxysmal anxiety] without agoraphobia: Secondary | ICD-10-CM | POA: Diagnosis not present

## 2019-07-10 DIAGNOSIS — M5416 Radiculopathy, lumbar region: Secondary | ICD-10-CM | POA: Diagnosis not present

## 2019-07-10 DIAGNOSIS — I10 Essential (primary) hypertension: Secondary | ICD-10-CM | POA: Diagnosis not present

## 2019-07-10 DIAGNOSIS — Z6831 Body mass index (BMI) 31.0-31.9, adult: Secondary | ICD-10-CM | POA: Diagnosis not present

## 2019-07-10 DIAGNOSIS — M545 Low back pain: Secondary | ICD-10-CM | POA: Diagnosis not present

## 2019-07-10 DIAGNOSIS — M5126 Other intervertebral disc displacement, lumbar region: Secondary | ICD-10-CM | POA: Diagnosis not present

## 2019-07-10 DIAGNOSIS — E119 Type 2 diabetes mellitus without complications: Secondary | ICD-10-CM | POA: Diagnosis not present

## 2019-07-12 DIAGNOSIS — Z6835 Body mass index (BMI) 35.0-35.9, adult: Secondary | ICD-10-CM | POA: Diagnosis not present

## 2019-07-12 DIAGNOSIS — R944 Abnormal results of kidney function studies: Secondary | ICD-10-CM | POA: Diagnosis not present

## 2019-07-12 DIAGNOSIS — Z712 Person consulting for explanation of examination or test findings: Secondary | ICD-10-CM | POA: Diagnosis not present

## 2019-07-12 DIAGNOSIS — F419 Anxiety disorder, unspecified: Secondary | ICD-10-CM | POA: Diagnosis not present

## 2019-07-12 DIAGNOSIS — G894 Chronic pain syndrome: Secondary | ICD-10-CM | POA: Diagnosis not present

## 2019-07-12 DIAGNOSIS — E782 Mixed hyperlipidemia: Secondary | ICD-10-CM | POA: Diagnosis not present

## 2019-07-12 DIAGNOSIS — M542 Cervicalgia: Secondary | ICD-10-CM | POA: Diagnosis not present

## 2019-07-12 DIAGNOSIS — E1165 Type 2 diabetes mellitus with hyperglycemia: Secondary | ICD-10-CM | POA: Diagnosis not present

## 2019-07-12 DIAGNOSIS — R69 Illness, unspecified: Secondary | ICD-10-CM | POA: Diagnosis not present

## 2019-07-12 DIAGNOSIS — N183 Chronic kidney disease, stage 3 unspecified: Secondary | ICD-10-CM | POA: Diagnosis not present

## 2019-07-17 ENCOUNTER — Other Ambulatory Visit (HOSPITAL_COMMUNITY): Payer: Self-pay | Admitting: Neurosurgery

## 2019-07-17 ENCOUNTER — Other Ambulatory Visit: Payer: Self-pay | Admitting: Neurosurgery

## 2019-07-17 DIAGNOSIS — M545 Low back pain, unspecified: Secondary | ICD-10-CM

## 2019-07-17 DIAGNOSIS — M5416 Radiculopathy, lumbar region: Secondary | ICD-10-CM

## 2019-08-09 DIAGNOSIS — G894 Chronic pain syndrome: Secondary | ICD-10-CM | POA: Diagnosis not present

## 2019-08-09 DIAGNOSIS — J302 Other seasonal allergic rhinitis: Secondary | ICD-10-CM | POA: Diagnosis not present

## 2019-08-09 DIAGNOSIS — R69 Illness, unspecified: Secondary | ICD-10-CM | POA: Diagnosis not present

## 2019-08-09 DIAGNOSIS — J45998 Other asthma: Secondary | ICD-10-CM | POA: Diagnosis not present

## 2019-08-09 DIAGNOSIS — I10 Essential (primary) hypertension: Secondary | ICD-10-CM | POA: Diagnosis not present

## 2019-08-09 DIAGNOSIS — M545 Low back pain: Secondary | ICD-10-CM | POA: Diagnosis not present

## 2019-08-17 ENCOUNTER — Ambulatory Visit (HOSPITAL_COMMUNITY)
Admission: RE | Admit: 2019-08-17 | Discharge: 2019-08-17 | Disposition: A | Discharge status: Home / Self Care | Payer: Medicare HMO | Source: Ambulatory Visit | Attending: Neurosurgery | Admitting: Neurosurgery

## 2019-08-17 ENCOUNTER — Other Ambulatory Visit: Payer: Self-pay

## 2019-08-17 DIAGNOSIS — M48061 Spinal stenosis, lumbar region without neurogenic claudication: Secondary | ICD-10-CM | POA: Diagnosis not present

## 2019-08-17 DIAGNOSIS — M5416 Radiculopathy, lumbar region: Secondary | ICD-10-CM | POA: Diagnosis not present

## 2019-08-17 DIAGNOSIS — M5126 Other intervertebral disc displacement, lumbar region: Secondary | ICD-10-CM | POA: Diagnosis not present

## 2019-08-17 DIAGNOSIS — M545 Low back pain, unspecified: Secondary | ICD-10-CM

## 2019-08-17 LAB — POCT I-STAT CREATININE: Creatinine, Ser: 0.9 mg/dL (ref 0.44–1.00)

## 2019-08-17 MED ORDER — GADOBUTROL 1 MMOL/ML IV SOLN
7.0000 mL | Freq: Once | INTRAVENOUS | Status: AC | PRN
  Administered 2019-08-17: 7 mL via INTRAVENOUS

## 2019-08-23 DIAGNOSIS — N1831 Chronic kidney disease, stage 3a: Secondary | ICD-10-CM | POA: Diagnosis not present

## 2019-08-23 DIAGNOSIS — G894 Chronic pain syndrome: Secondary | ICD-10-CM | POA: Diagnosis not present

## 2019-08-23 DIAGNOSIS — I10 Essential (primary) hypertension: Secondary | ICD-10-CM | POA: Diagnosis not present

## 2019-08-23 DIAGNOSIS — E782 Mixed hyperlipidemia: Secondary | ICD-10-CM | POA: Diagnosis not present

## 2019-08-23 DIAGNOSIS — R69 Illness, unspecified: Secondary | ICD-10-CM | POA: Diagnosis not present

## 2019-08-23 DIAGNOSIS — M545 Low back pain: Secondary | ICD-10-CM | POA: Diagnosis not present

## 2019-08-23 DIAGNOSIS — R944 Abnormal results of kidney function studies: Secondary | ICD-10-CM | POA: Diagnosis not present

## 2019-08-23 DIAGNOSIS — Z23 Encounter for immunization: Secondary | ICD-10-CM | POA: Diagnosis not present

## 2019-08-23 DIAGNOSIS — E1122 Type 2 diabetes mellitus with diabetic chronic kidney disease: Secondary | ICD-10-CM | POA: Diagnosis not present

## 2019-12-04 DIAGNOSIS — Z0001 Encounter for general adult medical examination with abnormal findings: Secondary | ICD-10-CM | POA: Diagnosis not present

## 2019-12-04 DIAGNOSIS — R419 Unspecified symptoms and signs involving cognitive functions and awareness: Secondary | ICD-10-CM | POA: Diagnosis not present

## 2019-12-04 DIAGNOSIS — E039 Hypothyroidism, unspecified: Secondary | ICD-10-CM | POA: Diagnosis not present

## 2019-12-18 DIAGNOSIS — E119 Type 2 diabetes mellitus without complications: Secondary | ICD-10-CM | POA: Diagnosis not present

## 2019-12-18 DIAGNOSIS — E1169 Type 2 diabetes mellitus with other specified complication: Secondary | ICD-10-CM | POA: Diagnosis not present

## 2019-12-18 DIAGNOSIS — E1122 Type 2 diabetes mellitus with diabetic chronic kidney disease: Secondary | ICD-10-CM | POA: Diagnosis not present

## 2019-12-18 DIAGNOSIS — R69 Illness, unspecified: Secondary | ICD-10-CM | POA: Diagnosis not present

## 2019-12-18 DIAGNOSIS — E782 Mixed hyperlipidemia: Secondary | ICD-10-CM | POA: Diagnosis not present

## 2019-12-18 DIAGNOSIS — E785 Hyperlipidemia, unspecified: Secondary | ICD-10-CM | POA: Diagnosis not present

## 2019-12-18 DIAGNOSIS — E1165 Type 2 diabetes mellitus with hyperglycemia: Secondary | ICD-10-CM | POA: Diagnosis not present

## 2019-12-27 DIAGNOSIS — E782 Mixed hyperlipidemia: Secondary | ICD-10-CM | POA: Diagnosis not present

## 2019-12-27 DIAGNOSIS — G894 Chronic pain syndrome: Secondary | ICD-10-CM | POA: Diagnosis not present

## 2019-12-27 DIAGNOSIS — R69 Illness, unspecified: Secondary | ICD-10-CM | POA: Diagnosis not present

## 2019-12-27 DIAGNOSIS — E1122 Type 2 diabetes mellitus with diabetic chronic kidney disease: Secondary | ICD-10-CM | POA: Diagnosis not present

## 2019-12-27 DIAGNOSIS — I10 Essential (primary) hypertension: Secondary | ICD-10-CM | POA: Diagnosis not present

## 2019-12-27 DIAGNOSIS — Z23 Encounter for immunization: Secondary | ICD-10-CM | POA: Diagnosis not present

## 2019-12-27 DIAGNOSIS — R05 Cough: Secondary | ICD-10-CM | POA: Diagnosis not present

## 2019-12-27 DIAGNOSIS — M545 Low back pain: Secondary | ICD-10-CM | POA: Diagnosis not present

## 2019-12-27 DIAGNOSIS — R944 Abnormal results of kidney function studies: Secondary | ICD-10-CM | POA: Diagnosis not present

## 2019-12-27 DIAGNOSIS — J069 Acute upper respiratory infection, unspecified: Secondary | ICD-10-CM | POA: Diagnosis not present

## 2019-12-31 DIAGNOSIS — R0602 Shortness of breath: Secondary | ICD-10-CM | POA: Diagnosis not present

## 2019-12-31 DIAGNOSIS — R062 Wheezing: Secondary | ICD-10-CM | POA: Diagnosis not present

## 2019-12-31 DIAGNOSIS — Z87891 Personal history of nicotine dependence: Secondary | ICD-10-CM | POA: Diagnosis not present

## 2019-12-31 DIAGNOSIS — M1990 Unspecified osteoarthritis, unspecified site: Secondary | ICD-10-CM | POA: Diagnosis not present

## 2019-12-31 DIAGNOSIS — E119 Type 2 diabetes mellitus without complications: Secondary | ICD-10-CM | POA: Diagnosis not present

## 2019-12-31 DIAGNOSIS — Z20822 Contact with and (suspected) exposure to covid-19: Secondary | ICD-10-CM | POA: Diagnosis not present

## 2019-12-31 DIAGNOSIS — J441 Chronic obstructive pulmonary disease with (acute) exacerbation: Secondary | ICD-10-CM | POA: Diagnosis not present

## 2019-12-31 DIAGNOSIS — J45909 Unspecified asthma, uncomplicated: Secondary | ICD-10-CM | POA: Diagnosis not present

## 2019-12-31 DIAGNOSIS — Z7984 Long term (current) use of oral hypoglycemic drugs: Secondary | ICD-10-CM | POA: Diagnosis not present

## 2019-12-31 DIAGNOSIS — R05 Cough: Secondary | ICD-10-CM | POA: Diagnosis not present

## 2019-12-31 DIAGNOSIS — E785 Hyperlipidemia, unspecified: Secondary | ICD-10-CM | POA: Diagnosis not present

## 2019-12-31 DIAGNOSIS — R69 Illness, unspecified: Secondary | ICD-10-CM | POA: Diagnosis not present

## 2019-12-31 DIAGNOSIS — I10 Essential (primary) hypertension: Secondary | ICD-10-CM | POA: Diagnosis not present

## 2020-02-06 ENCOUNTER — Ambulatory Visit (HOSPITAL_COMMUNITY)
Admission: EM | Admit: 2020-02-06 | Discharge: 2020-02-06 | Disposition: A | Discharge status: Home / Self Care | Payer: Medicare HMO

## 2020-02-06 NOTE — ED Notes (Signed)
Per Dahlia Byes, NP pt has the choice of being seen/evaluated here or going home and continuing to monitor BP, follow up with PCP, continue HTN meds, and follow up with DDS. Pt opted to go home 2/2 no symptoms and BP trending in pt's normal range before taking bp meds.

## 2020-02-06 NOTE — ED Triage Notes (Addendum)
Pt called from waiting area to check vitals. Pt states her dentist measured her BP at 267/222. Pt states the BP cuff did not inflate while at the dentist's office. Pt states she took her BP meds  Pt states her BP trends in 130s/88 approximately. approx 30 minutes PTA at [facility]. Pt denies any dizziness, HA, extremity weakness, blurred vision, or other c/o other than painful tooth.

## 2020-03-21 DIAGNOSIS — E1122 Type 2 diabetes mellitus with diabetic chronic kidney disease: Secondary | ICD-10-CM | POA: Diagnosis not present

## 2020-03-21 DIAGNOSIS — E7849 Other hyperlipidemia: Secondary | ICD-10-CM | POA: Diagnosis not present

## 2020-03-21 DIAGNOSIS — I129 Hypertensive chronic kidney disease with stage 1 through stage 4 chronic kidney disease, or unspecified chronic kidney disease: Secondary | ICD-10-CM | POA: Diagnosis not present

## 2020-03-21 DIAGNOSIS — N189 Chronic kidney disease, unspecified: Secondary | ICD-10-CM | POA: Diagnosis not present

## 2020-03-29 DIAGNOSIS — Z6834 Body mass index (BMI) 34.0-34.9, adult: Secondary | ICD-10-CM | POA: Diagnosis not present

## 2020-03-29 DIAGNOSIS — E1165 Type 2 diabetes mellitus with hyperglycemia: Secondary | ICD-10-CM | POA: Diagnosis not present

## 2020-03-29 DIAGNOSIS — M542 Cervicalgia: Secondary | ICD-10-CM | POA: Diagnosis not present

## 2020-03-29 DIAGNOSIS — R69 Illness, unspecified: Secondary | ICD-10-CM | POA: Diagnosis not present

## 2020-03-29 DIAGNOSIS — Z712 Person consulting for explanation of examination or test findings: Secondary | ICD-10-CM | POA: Diagnosis not present

## 2020-03-29 DIAGNOSIS — R944 Abnormal results of kidney function studies: Secondary | ICD-10-CM | POA: Diagnosis not present

## 2020-03-29 DIAGNOSIS — Z6835 Body mass index (BMI) 35.0-35.9, adult: Secondary | ICD-10-CM | POA: Diagnosis not present

## 2020-03-29 DIAGNOSIS — G894 Chronic pain syndrome: Secondary | ICD-10-CM | POA: Diagnosis not present

## 2020-03-29 DIAGNOSIS — F419 Anxiety disorder, unspecified: Secondary | ICD-10-CM | POA: Diagnosis not present

## 2020-03-29 DIAGNOSIS — E782 Mixed hyperlipidemia: Secondary | ICD-10-CM | POA: Diagnosis not present

## 2020-04-02 DIAGNOSIS — G2581 Restless legs syndrome: Secondary | ICD-10-CM | POA: Diagnosis not present

## 2020-04-02 DIAGNOSIS — N1831 Chronic kidney disease, stage 3a: Secondary | ICD-10-CM | POA: Diagnosis not present

## 2020-04-02 DIAGNOSIS — G894 Chronic pain syndrome: Secondary | ICD-10-CM | POA: Diagnosis not present

## 2020-04-02 DIAGNOSIS — K219 Gastro-esophageal reflux disease without esophagitis: Secondary | ICD-10-CM | POA: Diagnosis not present

## 2020-04-02 DIAGNOSIS — E669 Obesity, unspecified: Secondary | ICD-10-CM | POA: Diagnosis not present

## 2020-04-02 DIAGNOSIS — R944 Abnormal results of kidney function studies: Secondary | ICD-10-CM | POA: Diagnosis not present

## 2020-04-02 DIAGNOSIS — E782 Mixed hyperlipidemia: Secondary | ICD-10-CM | POA: Diagnosis not present

## 2020-04-02 DIAGNOSIS — E1122 Type 2 diabetes mellitus with diabetic chronic kidney disease: Secondary | ICD-10-CM | POA: Diagnosis not present

## 2020-04-02 DIAGNOSIS — I10 Essential (primary) hypertension: Secondary | ICD-10-CM | POA: Diagnosis not present

## 2020-04-02 DIAGNOSIS — R69 Illness, unspecified: Secondary | ICD-10-CM | POA: Diagnosis not present

## 2020-04-10 DIAGNOSIS — N183 Chronic kidney disease, stage 3 unspecified: Secondary | ICD-10-CM | POA: Diagnosis not present

## 2020-04-10 DIAGNOSIS — E1122 Type 2 diabetes mellitus with diabetic chronic kidney disease: Secondary | ICD-10-CM | POA: Diagnosis not present

## 2020-04-10 DIAGNOSIS — E7849 Other hyperlipidemia: Secondary | ICD-10-CM | POA: Diagnosis not present

## 2020-04-10 DIAGNOSIS — I129 Hypertensive chronic kidney disease with stage 1 through stage 4 chronic kidney disease, or unspecified chronic kidney disease: Secondary | ICD-10-CM | POA: Diagnosis not present

## 2020-04-21 DIAGNOSIS — E1122 Type 2 diabetes mellitus with diabetic chronic kidney disease: Secondary | ICD-10-CM | POA: Diagnosis not present

## 2020-04-21 DIAGNOSIS — E7849 Other hyperlipidemia: Secondary | ICD-10-CM | POA: Diagnosis not present

## 2020-04-21 DIAGNOSIS — I129 Hypertensive chronic kidney disease with stage 1 through stage 4 chronic kidney disease, or unspecified chronic kidney disease: Secondary | ICD-10-CM | POA: Diagnosis not present

## 2020-04-21 DIAGNOSIS — N183 Chronic kidney disease, stage 3 unspecified: Secondary | ICD-10-CM | POA: Diagnosis not present

## 2020-06-06 DIAGNOSIS — Z0100 Encounter for examination of eyes and vision without abnormal findings: Secondary | ICD-10-CM | POA: Diagnosis not present

## 2020-06-06 DIAGNOSIS — E119 Type 2 diabetes mellitus without complications: Secondary | ICD-10-CM | POA: Diagnosis not present

## 2020-06-06 DIAGNOSIS — H5213 Myopia, bilateral: Secondary | ICD-10-CM | POA: Diagnosis not present

## 2020-06-06 DIAGNOSIS — H524 Presbyopia: Secondary | ICD-10-CM | POA: Diagnosis not present

## 2020-06-12 DIAGNOSIS — R079 Chest pain, unspecified: Secondary | ICD-10-CM | POA: Diagnosis not present

## 2020-06-12 DIAGNOSIS — R748 Abnormal levels of other serum enzymes: Secondary | ICD-10-CM | POA: Diagnosis not present

## 2020-06-12 DIAGNOSIS — R069 Unspecified abnormalities of breathing: Secondary | ICD-10-CM | POA: Diagnosis not present

## 2020-06-12 DIAGNOSIS — Z79899 Other long term (current) drug therapy: Secondary | ICD-10-CM | POA: Diagnosis not present

## 2020-06-12 DIAGNOSIS — R52 Pain, unspecified: Secondary | ICD-10-CM | POA: Diagnosis not present

## 2020-06-12 DIAGNOSIS — M549 Dorsalgia, unspecified: Secondary | ICD-10-CM | POA: Diagnosis not present

## 2020-06-12 DIAGNOSIS — K8020 Calculus of gallbladder without cholecystitis without obstruction: Secondary | ICD-10-CM | POA: Diagnosis not present

## 2020-06-12 DIAGNOSIS — R69 Illness, unspecified: Secondary | ICD-10-CM | POA: Diagnosis not present

## 2020-06-12 DIAGNOSIS — E785 Hyperlipidemia, unspecified: Secondary | ICD-10-CM | POA: Diagnosis not present

## 2020-06-12 DIAGNOSIS — R1013 Epigastric pain: Secondary | ICD-10-CM | POA: Diagnosis not present

## 2020-06-12 DIAGNOSIS — R109 Unspecified abdominal pain: Secondary | ICD-10-CM | POA: Diagnosis not present

## 2020-06-12 DIAGNOSIS — D72829 Elevated white blood cell count, unspecified: Secondary | ICD-10-CM | POA: Diagnosis not present

## 2020-06-12 DIAGNOSIS — Z20822 Contact with and (suspected) exposure to covid-19: Secondary | ICD-10-CM | POA: Diagnosis not present

## 2020-06-12 DIAGNOSIS — K810 Acute cholecystitis: Secondary | ICD-10-CM | POA: Diagnosis not present

## 2020-06-12 DIAGNOSIS — K8000 Calculus of gallbladder with acute cholecystitis without obstruction: Secondary | ICD-10-CM | POA: Diagnosis not present

## 2020-06-12 DIAGNOSIS — K760 Fatty (change of) liver, not elsewhere classified: Secondary | ICD-10-CM | POA: Diagnosis not present

## 2020-06-12 DIAGNOSIS — I10 Essential (primary) hypertension: Secondary | ICD-10-CM | POA: Diagnosis not present

## 2020-06-12 DIAGNOSIS — E119 Type 2 diabetes mellitus without complications: Secondary | ICD-10-CM | POA: Diagnosis not present

## 2020-06-12 DIAGNOSIS — K5730 Diverticulosis of large intestine without perforation or abscess without bleeding: Secondary | ICD-10-CM | POA: Diagnosis not present

## 2020-06-12 DIAGNOSIS — J449 Chronic obstructive pulmonary disease, unspecified: Secondary | ICD-10-CM | POA: Diagnosis not present

## 2020-06-12 DIAGNOSIS — Z880 Allergy status to penicillin: Secondary | ICD-10-CM | POA: Diagnosis not present

## 2020-06-13 DIAGNOSIS — Z7984 Long term (current) use of oral hypoglycemic drugs: Secondary | ICD-10-CM | POA: Diagnosis not present

## 2020-06-13 DIAGNOSIS — R748 Abnormal levels of other serum enzymes: Secondary | ICD-10-CM | POA: Diagnosis not present

## 2020-06-13 DIAGNOSIS — K810 Acute cholecystitis: Secondary | ICD-10-CM | POA: Diagnosis not present

## 2020-06-13 DIAGNOSIS — R109 Unspecified abdominal pain: Secondary | ICD-10-CM | POA: Diagnosis not present

## 2020-06-13 DIAGNOSIS — D72829 Elevated white blood cell count, unspecified: Secondary | ICD-10-CM | POA: Diagnosis not present

## 2020-06-13 DIAGNOSIS — E785 Hyperlipidemia, unspecified: Secondary | ICD-10-CM | POA: Diagnosis not present

## 2020-06-13 DIAGNOSIS — R531 Weakness: Secondary | ICD-10-CM | POA: Diagnosis not present

## 2020-06-13 DIAGNOSIS — K760 Fatty (change of) liver, not elsewhere classified: Secondary | ICD-10-CM | POA: Diagnosis not present

## 2020-06-13 DIAGNOSIS — K8080 Other cholelithiasis without obstruction: Secondary | ICD-10-CM | POA: Diagnosis not present

## 2020-06-13 DIAGNOSIS — K8012 Calculus of gallbladder with acute and chronic cholecystitis without obstruction: Secondary | ICD-10-CM | POA: Diagnosis not present

## 2020-06-13 DIAGNOSIS — M549 Dorsalgia, unspecified: Secondary | ICD-10-CM | POA: Diagnosis not present

## 2020-06-13 DIAGNOSIS — R110 Nausea: Secondary | ICD-10-CM | POA: Diagnosis not present

## 2020-06-13 DIAGNOSIS — Z20822 Contact with and (suspected) exposure to covid-19: Secondary | ICD-10-CM | POA: Diagnosis not present

## 2020-06-13 DIAGNOSIS — K8000 Calculus of gallbladder with acute cholecystitis without obstruction: Secondary | ICD-10-CM | POA: Diagnosis not present

## 2020-06-13 DIAGNOSIS — K8020 Calculus of gallbladder without cholecystitis without obstruction: Secondary | ICD-10-CM | POA: Diagnosis not present

## 2020-06-13 DIAGNOSIS — E119 Type 2 diabetes mellitus without complications: Secondary | ICD-10-CM | POA: Diagnosis not present

## 2020-06-13 DIAGNOSIS — J449 Chronic obstructive pulmonary disease, unspecified: Secondary | ICD-10-CM | POA: Diagnosis not present

## 2020-06-13 DIAGNOSIS — R1084 Generalized abdominal pain: Secondary | ICD-10-CM | POA: Diagnosis not present

## 2020-06-13 DIAGNOSIS — I10 Essential (primary) hypertension: Secondary | ICD-10-CM | POA: Diagnosis not present

## 2020-06-14 DIAGNOSIS — E119 Type 2 diabetes mellitus without complications: Secondary | ICD-10-CM | POA: Diagnosis not present

## 2020-06-14 DIAGNOSIS — K8012 Calculus of gallbladder with acute and chronic cholecystitis without obstruction: Secondary | ICD-10-CM | POA: Diagnosis not present

## 2020-06-14 DIAGNOSIS — I10 Essential (primary) hypertension: Secondary | ICD-10-CM | POA: Diagnosis not present

## 2020-06-14 DIAGNOSIS — K810 Acute cholecystitis: Secondary | ICD-10-CM | POA: Diagnosis not present

## 2020-06-14 DIAGNOSIS — K8000 Calculus of gallbladder with acute cholecystitis without obstruction: Secondary | ICD-10-CM | POA: Diagnosis not present

## 2020-06-15 DIAGNOSIS — K8000 Calculus of gallbladder with acute cholecystitis without obstruction: Secondary | ICD-10-CM | POA: Diagnosis not present

## 2020-06-16 DIAGNOSIS — K8000 Calculus of gallbladder with acute cholecystitis without obstruction: Secondary | ICD-10-CM | POA: Diagnosis not present

## 2020-06-16 DIAGNOSIS — I10 Essential (primary) hypertension: Secondary | ICD-10-CM | POA: Diagnosis not present

## 2020-06-16 DIAGNOSIS — E119 Type 2 diabetes mellitus without complications: Secondary | ICD-10-CM | POA: Diagnosis not present

## 2020-07-12 DIAGNOSIS — R944 Abnormal results of kidney function studies: Secondary | ICD-10-CM | POA: Diagnosis not present

## 2020-07-12 DIAGNOSIS — N183 Chronic kidney disease, stage 3 unspecified: Secondary | ICD-10-CM | POA: Diagnosis not present

## 2020-07-12 DIAGNOSIS — F419 Anxiety disorder, unspecified: Secondary | ICD-10-CM | POA: Diagnosis not present

## 2020-07-12 DIAGNOSIS — Z6835 Body mass index (BMI) 35.0-35.9, adult: Secondary | ICD-10-CM | POA: Diagnosis not present

## 2020-07-12 DIAGNOSIS — G894 Chronic pain syndrome: Secondary | ICD-10-CM | POA: Diagnosis not present

## 2020-07-12 DIAGNOSIS — E1165 Type 2 diabetes mellitus with hyperglycemia: Secondary | ICD-10-CM | POA: Diagnosis not present

## 2020-07-12 DIAGNOSIS — Z712 Person consulting for explanation of examination or test findings: Secondary | ICD-10-CM | POA: Diagnosis not present

## 2020-07-12 DIAGNOSIS — E782 Mixed hyperlipidemia: Secondary | ICD-10-CM | POA: Diagnosis not present

## 2020-07-12 DIAGNOSIS — M542 Cervicalgia: Secondary | ICD-10-CM | POA: Diagnosis not present

## 2020-07-12 DIAGNOSIS — R69 Illness, unspecified: Secondary | ICD-10-CM | POA: Diagnosis not present

## 2020-07-16 DIAGNOSIS — G894 Chronic pain syndrome: Secondary | ICD-10-CM | POA: Diagnosis not present

## 2020-07-16 DIAGNOSIS — I10 Essential (primary) hypertension: Secondary | ICD-10-CM | POA: Diagnosis not present

## 2020-07-16 DIAGNOSIS — R944 Abnormal results of kidney function studies: Secondary | ICD-10-CM | POA: Diagnosis not present

## 2020-07-16 DIAGNOSIS — R69 Illness, unspecified: Secondary | ICD-10-CM | POA: Diagnosis not present

## 2020-07-16 DIAGNOSIS — G2581 Restless legs syndrome: Secondary | ICD-10-CM | POA: Diagnosis not present

## 2020-07-16 DIAGNOSIS — E669 Obesity, unspecified: Secondary | ICD-10-CM | POA: Diagnosis not present

## 2020-07-16 DIAGNOSIS — E1122 Type 2 diabetes mellitus with diabetic chronic kidney disease: Secondary | ICD-10-CM | POA: Diagnosis not present

## 2020-07-16 DIAGNOSIS — N1831 Chronic kidney disease, stage 3a: Secondary | ICD-10-CM | POA: Diagnosis not present

## 2020-07-16 DIAGNOSIS — K219 Gastro-esophageal reflux disease without esophagitis: Secondary | ICD-10-CM | POA: Diagnosis not present

## 2020-07-16 DIAGNOSIS — E782 Mixed hyperlipidemia: Secondary | ICD-10-CM | POA: Diagnosis not present

## 2020-08-19 ENCOUNTER — Other Ambulatory Visit (HOSPITAL_COMMUNITY): Payer: Self-pay | Admitting: Internal Medicine

## 2020-08-19 ENCOUNTER — Encounter (INDEPENDENT_AMBULATORY_CARE_PROVIDER_SITE_OTHER): Payer: Self-pay | Admitting: *Deleted

## 2020-08-19 DIAGNOSIS — Z1231 Encounter for screening mammogram for malignant neoplasm of breast: Secondary | ICD-10-CM

## 2020-08-28 ENCOUNTER — Ambulatory Visit (HOSPITAL_COMMUNITY)
Admission: RE | Admit: 2020-08-28 | Discharge: 2020-08-28 | Disposition: A | Discharge status: Home / Self Care | Payer: Medicare HMO | Source: Ambulatory Visit | Attending: Internal Medicine | Admitting: Internal Medicine

## 2020-08-28 ENCOUNTER — Other Ambulatory Visit: Payer: Self-pay

## 2020-08-28 DIAGNOSIS — Z1231 Encounter for screening mammogram for malignant neoplasm of breast: Secondary | ICD-10-CM | POA: Diagnosis not present

## 2020-09-03 ENCOUNTER — Other Ambulatory Visit (HOSPITAL_COMMUNITY): Payer: Self-pay | Admitting: Internal Medicine

## 2020-09-03 DIAGNOSIS — R928 Other abnormal and inconclusive findings on diagnostic imaging of breast: Secondary | ICD-10-CM

## 2020-09-10 ENCOUNTER — Ambulatory Visit (HOSPITAL_COMMUNITY)
Admission: RE | Admit: 2020-09-10 | Discharge: 2020-09-10 | Disposition: A | Discharge status: Home / Self Care | Payer: Medicare HMO | Source: Ambulatory Visit | Attending: Internal Medicine | Admitting: Internal Medicine

## 2020-09-10 ENCOUNTER — Other Ambulatory Visit: Payer: Self-pay

## 2020-09-10 DIAGNOSIS — R922 Inconclusive mammogram: Secondary | ICD-10-CM | POA: Diagnosis not present

## 2020-09-10 DIAGNOSIS — N6489 Other specified disorders of breast: Secondary | ICD-10-CM | POA: Diagnosis not present

## 2020-09-10 DIAGNOSIS — R928 Other abnormal and inconclusive findings on diagnostic imaging of breast: Secondary | ICD-10-CM

## 2020-09-18 DIAGNOSIS — I10 Essential (primary) hypertension: Secondary | ICD-10-CM | POA: Diagnosis not present

## 2020-09-18 DIAGNOSIS — E1165 Type 2 diabetes mellitus with hyperglycemia: Secondary | ICD-10-CM | POA: Diagnosis not present

## 2020-10-07 DIAGNOSIS — G959 Disease of spinal cord, unspecified: Secondary | ICD-10-CM | POA: Diagnosis not present

## 2020-10-07 DIAGNOSIS — M4802 Spinal stenosis, cervical region: Secondary | ICD-10-CM | POA: Diagnosis not present

## 2020-10-07 DIAGNOSIS — M5412 Radiculopathy, cervical region: Secondary | ICD-10-CM | POA: Diagnosis not present

## 2020-10-07 DIAGNOSIS — M62838 Other muscle spasm: Secondary | ICD-10-CM | POA: Diagnosis not present

## 2020-10-07 DIAGNOSIS — M545 Low back pain, unspecified: Secondary | ICD-10-CM | POA: Diagnosis not present

## 2020-10-10 DIAGNOSIS — E785 Hyperlipidemia, unspecified: Secondary | ICD-10-CM | POA: Diagnosis not present

## 2020-10-10 DIAGNOSIS — E7849 Other hyperlipidemia: Secondary | ICD-10-CM | POA: Diagnosis not present

## 2020-10-10 DIAGNOSIS — N1831 Chronic kidney disease, stage 3a: Secondary | ICD-10-CM | POA: Diagnosis not present

## 2020-10-10 DIAGNOSIS — E1169 Type 2 diabetes mellitus with other specified complication: Secondary | ICD-10-CM | POA: Diagnosis not present

## 2020-10-10 DIAGNOSIS — E782 Mixed hyperlipidemia: Secondary | ICD-10-CM | POA: Diagnosis not present

## 2020-10-10 DIAGNOSIS — E1122 Type 2 diabetes mellitus with diabetic chronic kidney disease: Secondary | ICD-10-CM | POA: Diagnosis not present

## 2020-10-10 DIAGNOSIS — R944 Abnormal results of kidney function studies: Secondary | ICD-10-CM | POA: Diagnosis not present

## 2020-10-10 DIAGNOSIS — I129 Hypertensive chronic kidney disease with stage 1 through stage 4 chronic kidney disease, or unspecified chronic kidney disease: Secondary | ICD-10-CM | POA: Diagnosis not present

## 2020-10-17 DIAGNOSIS — E669 Obesity, unspecified: Secondary | ICD-10-CM | POA: Diagnosis not present

## 2020-10-17 DIAGNOSIS — N1831 Chronic kidney disease, stage 3a: Secondary | ICD-10-CM | POA: Diagnosis not present

## 2020-10-17 DIAGNOSIS — R944 Abnormal results of kidney function studies: Secondary | ICD-10-CM | POA: Diagnosis not present

## 2020-10-17 DIAGNOSIS — I10 Essential (primary) hypertension: Secondary | ICD-10-CM | POA: Diagnosis not present

## 2020-10-17 DIAGNOSIS — G894 Chronic pain syndrome: Secondary | ICD-10-CM | POA: Diagnosis not present

## 2020-10-17 DIAGNOSIS — K219 Gastro-esophageal reflux disease without esophagitis: Secondary | ICD-10-CM | POA: Diagnosis not present

## 2020-10-17 DIAGNOSIS — G2581 Restless legs syndrome: Secondary | ICD-10-CM | POA: Diagnosis not present

## 2020-10-17 DIAGNOSIS — E1122 Type 2 diabetes mellitus with diabetic chronic kidney disease: Secondary | ICD-10-CM | POA: Diagnosis not present

## 2020-10-17 DIAGNOSIS — R69 Illness, unspecified: Secondary | ICD-10-CM | POA: Diagnosis not present

## 2020-10-17 DIAGNOSIS — E782 Mixed hyperlipidemia: Secondary | ICD-10-CM | POA: Diagnosis not present

## 2020-10-29 DIAGNOSIS — N6310 Unspecified lump in the right breast, unspecified quadrant: Secondary | ICD-10-CM | POA: Diagnosis not present

## 2020-10-29 DIAGNOSIS — J45909 Unspecified asthma, uncomplicated: Secondary | ICD-10-CM | POA: Diagnosis not present

## 2020-10-29 DIAGNOSIS — G2581 Restless legs syndrome: Secondary | ICD-10-CM | POA: Diagnosis not present

## 2020-10-30 ENCOUNTER — Other Ambulatory Visit (HOSPITAL_COMMUNITY): Payer: Self-pay | Admitting: Family Medicine

## 2020-10-30 DIAGNOSIS — N63 Unspecified lump in unspecified breast: Secondary | ICD-10-CM

## 2020-11-05 DIAGNOSIS — S93402A Sprain of unspecified ligament of left ankle, initial encounter: Secondary | ICD-10-CM | POA: Diagnosis not present

## 2020-11-05 DIAGNOSIS — M25572 Pain in left ankle and joints of left foot: Secondary | ICD-10-CM | POA: Diagnosis not present

## 2020-11-05 DIAGNOSIS — Z880 Allergy status to penicillin: Secondary | ICD-10-CM | POA: Diagnosis not present

## 2020-11-05 DIAGNOSIS — Z79899 Other long term (current) drug therapy: Secondary | ICD-10-CM | POA: Diagnosis not present

## 2020-11-05 DIAGNOSIS — J449 Chronic obstructive pulmonary disease, unspecified: Secondary | ICD-10-CM | POA: Diagnosis not present

## 2020-11-05 DIAGNOSIS — X58XXXA Exposure to other specified factors, initial encounter: Secondary | ICD-10-CM | POA: Diagnosis not present

## 2020-11-05 DIAGNOSIS — E119 Type 2 diabetes mellitus without complications: Secondary | ICD-10-CM | POA: Diagnosis not present

## 2020-11-05 DIAGNOSIS — E785 Hyperlipidemia, unspecified: Secondary | ICD-10-CM | POA: Diagnosis not present

## 2020-11-05 DIAGNOSIS — M79672 Pain in left foot: Secondary | ICD-10-CM | POA: Diagnosis not present

## 2020-11-05 DIAGNOSIS — I10 Essential (primary) hypertension: Secondary | ICD-10-CM | POA: Diagnosis not present

## 2020-11-05 DIAGNOSIS — X501XXA Overexertion from prolonged static or awkward postures, initial encounter: Secondary | ICD-10-CM | POA: Diagnosis not present

## 2020-11-07 ENCOUNTER — Encounter: Payer: Self-pay | Admitting: *Deleted

## 2020-11-10 ENCOUNTER — Emergency Department (HOSPITAL_COMMUNITY): Payer: Medicare HMO

## 2020-11-10 ENCOUNTER — Other Ambulatory Visit: Payer: Self-pay

## 2020-11-10 ENCOUNTER — Emergency Department (HOSPITAL_COMMUNITY)
Admission: EM | Admit: 2020-11-10 | Discharge: 2020-11-11 | Disposition: A | Discharge status: Home / Self Care | Payer: Medicare HMO | Attending: Emergency Medicine | Admitting: Emergency Medicine

## 2020-11-10 ENCOUNTER — Encounter (HOSPITAL_COMMUNITY): Payer: Self-pay | Admitting: Emergency Medicine

## 2020-11-10 DIAGNOSIS — Z7984 Long term (current) use of oral hypoglycemic drugs: Secondary | ICD-10-CM | POA: Insufficient documentation

## 2020-11-10 DIAGNOSIS — Z79899 Other long term (current) drug therapy: Secondary | ICD-10-CM | POA: Diagnosis not present

## 2020-11-10 DIAGNOSIS — J45909 Unspecified asthma, uncomplicated: Secondary | ICD-10-CM | POA: Insufficient documentation

## 2020-11-10 DIAGNOSIS — J441 Chronic obstructive pulmonary disease with (acute) exacerbation: Secondary | ICD-10-CM

## 2020-11-10 DIAGNOSIS — E119 Type 2 diabetes mellitus without complications: Secondary | ICD-10-CM | POA: Diagnosis not present

## 2020-11-10 DIAGNOSIS — I10 Essential (primary) hypertension: Secondary | ICD-10-CM | POA: Diagnosis not present

## 2020-11-10 DIAGNOSIS — Z20822 Contact with and (suspected) exposure to covid-19: Secondary | ICD-10-CM | POA: Diagnosis not present

## 2020-11-10 DIAGNOSIS — R059 Cough, unspecified: Secondary | ICD-10-CM | POA: Diagnosis not present

## 2020-11-10 DIAGNOSIS — R509 Fever, unspecified: Secondary | ICD-10-CM | POA: Diagnosis not present

## 2020-11-10 NOTE — ED Triage Notes (Addendum)
Pt states she has had a cough since Thursday but that it has "gotten worse today. States she cannot sleep because of it. Also states she started running a low grade fever tonight (100.4) so she took some Tylenol @ 2145. Temp in triage was 98.3. Pt states her boyfriend was sick last week as well.

## 2020-11-11 LAB — SARS CORONAVIRUS 2 (TAT 6-24 HRS): SARS Coronavirus 2: NEGATIVE

## 2020-11-11 MED ORDER — DEXAMETHASONE 4 MG PO TABS
10.0000 mg | ORAL_TABLET | Freq: Once | ORAL | Status: AC
  Administered 2020-11-11: 10 mg via ORAL
  Filled 2020-11-11: qty 3

## 2020-11-11 MED ORDER — IPRATROPIUM BROMIDE 0.02 % IN SOLN
0.5000 mg | Freq: Once | RESPIRATORY_TRACT | Status: AC
  Administered 2020-11-11: 0.5 mg via RESPIRATORY_TRACT
  Filled 2020-11-11: qty 2.5

## 2020-11-11 MED ORDER — AZITHROMYCIN 250 MG PO TABS: 250.0000 mg | ORAL_TABLET | Freq: Every day | ORAL | 0 refills | Status: DC

## 2020-11-11 MED ORDER — ALBUTEROL SULFATE (2.5 MG/3ML) 0.083% IN NEBU
5.0000 mg | INHALATION_SOLUTION | Freq: Once | RESPIRATORY_TRACT | Status: AC
  Administered 2020-11-11: 5 mg via RESPIRATORY_TRACT
  Filled 2020-11-11: qty 6

## 2020-11-11 NOTE — ED Provider Notes (Signed)
[facility] EMERGENCY DEPARTMENT Provider Note   CSN: [id number] Arrival date & time: 11/10/20  2317     History Chief Complaint  Patient presents with  . Cough    [name] is a 60 y.o. female.  The history is provided by the patient.  Cough Cough characteristics:  Non-productive Severity:  Moderate Onset quality:  Gradual Duration:  3 days Timing:  Intermittent Progression:  Worsening Chronicity:  New Smoker: yes   Relieved by:  Nothing Worsened by:  Nothing Associated symptoms: chills, fever, shortness of breath and wheezing   Patient reports history of asthma, diabetes presents with cough and shortness of breath.  Patient reports a sick contact.  She reports her significant other was diagnosed with bronchitis.  She reports over the past 3 days has had increasing harsh cough and wheezing and shortness of breath.  She reports chest wall soreness from coughing No hemoptysis.      Past Medical History:  Diagnosis Date  . Anxiety   . Arthritis   . Asthma   . Diabetes mellitus   . Hypertension   . Polycystic ovarian syndrome     Patient Active Problem List   Diagnosis Date Noted  . Depression 08/26/2016  . Diabetes ([place]) 04/12/2015  . Essential hypertension 04/12/2015  . Hot flashes, menopausal 04/12/2015  . Generalized anxiety disorder 04/12/2015  . Hyperlipidemia 04/12/2015    Past Surgical History:  Procedure Laterality Date  . CERVICAL FUSION       OB History   No obstetric history on file.     Family History  Problem Relation Age of Onset  . Atrial fibrillation Mother   . Cancer Mother   . Cancer Father     Social History   Tobacco Use  . Smoking status: Never Smoker  . Smokeless tobacco: Never Used  Vaping Use  . Vaping Use: Never used  Substance Use Topics  . Alcohol use: No  . Drug use: No    Home Medications Prior to Admission medications   Medication Sig Start Date End Date Taking? Authorizing Provider  azithromycin  (ZITHROMAX) 250 MG tablet Take 1 tablet (250 mg total) by mouth daily. Take first 2 tablets together, then 1 every day until finished. 11/11/20  Yes Ripley Fraise, MD  albuterol (PROVENTIL) (2.5 MG/3ML) 0.083% nebulizer solution Take 2.5 mg by nebulization every 6 (six) hours as needed.    [provider]  ALPRAZolam Duanne Moron) 0.5 MG tablet Take 1 tablet (0.5 mg total) by mouth at bedtime as needed for anxiety. 04/12/17   Eustaquio Maize, MD  Blood Glucose Monitoring Suppl (ONETOUCH VERIO) w/Device KIT 1 kit by Does not apply route 3 (three) times daily. 04/12/17   Eustaquio Maize, MD  cyclobenzaprine (FLEXERIL) 10 MG tablet Take 10 mg by mouth. 03/12/17   [provider]  glimepiride (AMARYL) 2 MG tablet Take 1 tablet (2 mg total) by mouth daily with breakfast. 04/16/17   Eustaquio Maize, MD  glucose blood [facility] VERIO) test strip Use as instructed 04/12/17   Eustaquio Maize, MD  glucose blood test strip Use to check blood sugars up to two times a day 12/11/16   Eustaquio Maize, MD  HYDROcodone-acetaminophen (NORCO) 10-325 MG tablet Take 1 tablet by mouth every 6 (six) hours as needed.    [provider]  Lancets Glory Rosebush ULTRASOFT) lancets Use as instructed 04/12/17   Eustaquio Maize, MD  lisinopril-hydrochlorothiazide (PRINZIDE,ZESTORETIC) 20-25 MG tablet TAKE 1 TABLET BY MOUTH  ONCE DAILY 04/12/17   Eustaquio Maize, MD  lovastatin (MEVACOR) 20 MG tablet Take 1 tablet (20 mg total) by mouth at bedtime. 04/13/17   Eustaquio Maize, MD  sitaGLIPtin (JANUVIA) 100 MG tablet Take 1 tablet (100 mg total) by mouth daily. 04/12/17   Eustaquio Maize, MD    Allergies    Coconut oil, Other, and Penicillins  Review of Systems   Review of Systems  Constitutional: Positive for chills and fever.  Respiratory: Positive for cough, shortness of breath and wheezing.   Cardiovascular: Negative for leg swelling.  Gastrointestinal:       Posttussive emesis  All other systems  reviewed and are negative.   Physical Exam Updated Vital Signs BP (!) 131/95 (BP Location: Right Arm)   Pulse (!) 104   Temp 98.3 F (36.8 C) (Oral)   Resp (!) 22   Ht 1.6 m (5' 3" )   Wt 77.6 kg   SpO2 96%   BMI 30.29 kg/m   Physical Exam CONSTITUTIONAL: Well developed/well nourished HEAD: Normocephalic/atraumatic EYES: EOMI/PERRL ENMT: Mucous membranes moist, uvula midline without erythema or exudate, no angioedema, no stridor NECK: supple no meningeal signs SPINE/BACK:entire spine nontender CV: S1/S2 noted, no murmurs/rubs/gallops noted LUNGS: Mild tachypnea, scattered wheezing bilaterally, no crackles.  Harsh cough noted throughout exam ABDOMEN: soft, nontender NEURO: Pt is awake/alert/appropriate, moves all extremitiesx4.  No facial droop.   EXTREMITIES: pulses normal/equal, full ROM, no obvious calf tenderness or edema SKIN: warm, color normal PSYCH: no abnormalities of mood noted, alert and oriented to situation  ED Results / Procedures / Treatments   Labs (all labs ordered are listed, but only abnormal results are displayed) Labs Reviewed  SARS CORONAVIRUS 2 (TAT 6-24 HRS)    EKG None  Radiology DG Chest Portable 1 View  Result Date: 11/11/2020 CLINICAL DATA:  Cough and fever. EXAM: PORTABLE CHEST 1 VIEW COMPARISON:  December 31, 2019 FINDINGS: Mildly decreased lung volumes are seen with very mildly increased lung markings. There is no evidence of focal consolidation, pleural effusion or pneumothorax. The heart size and mediastinal contours are within normal limits. A radiopaque fusion plate and screws are seen overlying the lower cervical spine. The visualized skeletal structures are unremarkable. IMPRESSION: Mildly decreased lung volumes without acute or active cardiopulmonary disease. Electronically Signed   By: Virgina Norfolk M.D.   On: 11/11/2020 00:24    Procedures Procedures   Medications Ordered in ED Medications  albuterol (PROVENTIL) (2.5 MG/3ML)  0.083% nebulizer solution 5 mg (5 mg Nebulization Given 11/11/20 0056)  ipratropium (ATROVENT) nebulizer solution 0.5 mg (0.5 mg Nebulization Given 11/11/20 0056)  dexamethasone (DECADRON) tablet 10 mg (10 mg Oral Given 11/11/20 0054)    ED Course  I have reviewed the triage vital signs and the nursing notes.  Pertinent  imaging results that were available during my care of the patient were reviewed by me and considered in my medical decision making (see chart for details).    MDM Rules/Calculators/A&P                           Patient very hesitant to start steroids due to her underlying diabetes.  She does agree to a one-time dose of Decadron.  She will need to continue to monitor glucose at home. Patient given nebulized therapy with some improvement. Patient is overall well-appearing in no acute distress at this time.  No hypoxia.  She reports history of asthma and I suspect  she has  COPD likely exacerbation.  I have prescribed a Z-Pak which she has had good response to previously, advised her to wait until she gets her COVID test results   [name] was evaluated in Emergency Department on 11/11/2020 for the symptoms described in the history of present illness. She was evaluated in the context of the global COVID-19 pandemic, which necessitated consideration that the patient might be at risk for infection with the SARS-CoV-2 virus that causes COVID-19. Institutional protocols and algorithms that pertain to the evaluation of patients at risk for COVID-19 are in a state of rapid change based on information released by regulatory bodies including the CDC and federal and state organizations. These policies and algorithms were followed during the patient's care in the ED.  Final Clinical Impression(s) / ED Diagnoses Final diagnoses:  Chronic obstructive pulmonary disease with acute exacerbation ([place])    Rx / DC Orders ED Discharge Orders         Ordered    azithromycin (ZITHROMAX) 250  MG tablet  Daily        11/11/20 0044           Ripley Fraise, MD 11/11/20 0149

## 2020-11-19 DIAGNOSIS — K219 Gastro-esophageal reflux disease without esophagitis: Secondary | ICD-10-CM | POA: Diagnosis not present

## 2020-11-19 DIAGNOSIS — E1165 Type 2 diabetes mellitus with hyperglycemia: Secondary | ICD-10-CM | POA: Diagnosis not present

## 2020-11-26 ENCOUNTER — Ambulatory Visit (HOSPITAL_COMMUNITY)
Admission: RE | Admit: 2020-11-26 | Discharge: 2020-11-26 | Disposition: A | Discharge status: Home / Self Care | Payer: Medicare HMO | Source: Ambulatory Visit | Attending: Family Medicine | Admitting: Family Medicine

## 2020-11-26 ENCOUNTER — Other Ambulatory Visit: Payer: Self-pay

## 2020-11-26 DIAGNOSIS — N63 Unspecified lump in unspecified breast: Secondary | ICD-10-CM | POA: Diagnosis not present

## 2020-11-26 DIAGNOSIS — R922 Inconclusive mammogram: Secondary | ICD-10-CM | POA: Diagnosis not present

## 2020-11-26 DIAGNOSIS — N6312 Unspecified lump in the right breast, upper inner quadrant: Secondary | ICD-10-CM | POA: Diagnosis not present

## 2020-11-27 ENCOUNTER — Other Ambulatory Visit (HOSPITAL_COMMUNITY): Payer: Self-pay | Admitting: Neurosurgery

## 2020-11-27 DIAGNOSIS — M62838 Other muscle spasm: Secondary | ICD-10-CM

## 2020-12-19 DIAGNOSIS — I10 Essential (primary) hypertension: Secondary | ICD-10-CM | POA: Diagnosis not present

## 2020-12-19 DIAGNOSIS — E7800 Pure hypercholesterolemia, unspecified: Secondary | ICD-10-CM | POA: Diagnosis not present

## 2021-01-16 DIAGNOSIS — I10 Essential (primary) hypertension: Secondary | ICD-10-CM | POA: Diagnosis not present

## 2021-01-16 DIAGNOSIS — E119 Type 2 diabetes mellitus without complications: Secondary | ICD-10-CM | POA: Diagnosis not present

## 2021-01-23 DIAGNOSIS — R69 Illness, unspecified: Secondary | ICD-10-CM | POA: Diagnosis not present

## 2021-01-23 DIAGNOSIS — E875 Hyperkalemia: Secondary | ICD-10-CM | POA: Diagnosis not present

## 2021-01-23 DIAGNOSIS — N1831 Chronic kidney disease, stage 3a: Secondary | ICD-10-CM | POA: Diagnosis not present

## 2021-01-23 DIAGNOSIS — I10 Essential (primary) hypertension: Secondary | ICD-10-CM | POA: Diagnosis not present

## 2021-01-23 DIAGNOSIS — G894 Chronic pain syndrome: Secondary | ICD-10-CM | POA: Diagnosis not present

## 2021-01-23 DIAGNOSIS — E669 Obesity, unspecified: Secondary | ICD-10-CM | POA: Diagnosis not present

## 2021-01-23 DIAGNOSIS — G2581 Restless legs syndrome: Secondary | ICD-10-CM | POA: Diagnosis not present

## 2021-01-23 DIAGNOSIS — E782 Mixed hyperlipidemia: Secondary | ICD-10-CM | POA: Diagnosis not present

## 2021-01-23 DIAGNOSIS — E1165 Type 2 diabetes mellitus with hyperglycemia: Secondary | ICD-10-CM | POA: Diagnosis not present

## 2021-01-23 DIAGNOSIS — K219 Gastro-esophageal reflux disease without esophagitis: Secondary | ICD-10-CM | POA: Diagnosis not present

## 2021-01-29 ENCOUNTER — Emergency Department (HOSPITAL_COMMUNITY)
Admission: EM | Admit: 2021-01-29 | Discharge: 2021-01-29 | Disposition: A | Discharge status: Home / Self Care | Payer: Medicare HMO | Attending: Emergency Medicine | Admitting: Emergency Medicine

## 2021-01-29 ENCOUNTER — Encounter (HOSPITAL_COMMUNITY): Payer: Self-pay | Admitting: Emergency Medicine

## 2021-01-29 ENCOUNTER — Emergency Department (HOSPITAL_COMMUNITY): Payer: Medicare HMO

## 2021-01-29 ENCOUNTER — Other Ambulatory Visit: Payer: Self-pay

## 2021-01-29 DIAGNOSIS — J45909 Unspecified asthma, uncomplicated: Secondary | ICD-10-CM | POA: Diagnosis not present

## 2021-01-29 DIAGNOSIS — E119 Type 2 diabetes mellitus without complications: Secondary | ICD-10-CM | POA: Insufficient documentation

## 2021-01-29 DIAGNOSIS — Z7984 Long term (current) use of oral hypoglycemic drugs: Secondary | ICD-10-CM | POA: Insufficient documentation

## 2021-01-29 DIAGNOSIS — M542 Cervicalgia: Secondary | ICD-10-CM

## 2021-01-29 DIAGNOSIS — I10 Essential (primary) hypertension: Secondary | ICD-10-CM | POA: Insufficient documentation

## 2021-01-29 NOTE — Discharge Instructions (Addendum)
You have been seen here for neck pain, I recommend taking over-the-counter pain medications like ibuprofen and/or Tylenol every 6 as needed.  Please follow dosage and on the back of bottle.  I also recommend applying heat to the area and stretching out the muscles as this will help decrease stiffness and pain.  I have given you information on exercises please follow.  Please follow-up with PCP for further evaluation.  Come back to the emergency department if you develop chest pain, shortness of breath, severe abdominal pain, uncontrolled nausea, vomiting, diarrhea.
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
## 2021-01-30 ENCOUNTER — Ambulatory Visit
Admission: RE | Admit: 2021-01-30 | Discharge: 2021-01-30 | Disposition: A | Discharge status: Home / Self Care | Payer: Medicare HMO | Source: Ambulatory Visit | Attending: Neurosurgery | Admitting: Neurosurgery

## 2021-01-30 DIAGNOSIS — M4802 Spinal stenosis, cervical region: Secondary | ICD-10-CM | POA: Diagnosis not present

## 2021-01-30 DIAGNOSIS — M62838 Other muscle spasm: Secondary | ICD-10-CM

## 2021-01-30 DIAGNOSIS — M5012 Mid-cervical disc disorder, unspecified level: Secondary | ICD-10-CM | POA: Diagnosis not present

## 2021-02-13 DIAGNOSIS — M5412 Radiculopathy, cervical region: Secondary | ICD-10-CM | POA: Diagnosis not present

## 2021-02-13 DIAGNOSIS — M4802 Spinal stenosis, cervical region: Secondary | ICD-10-CM | POA: Diagnosis not present

## 2021-02-13 DIAGNOSIS — G2581 Restless legs syndrome: Secondary | ICD-10-CM | POA: Diagnosis not present

## 2021-02-13 DIAGNOSIS — Z6833 Body mass index (BMI) 33.0-33.9, adult: Secondary | ICD-10-CM | POA: Diagnosis not present

## 2021-02-13 DIAGNOSIS — I10 Essential (primary) hypertension: Secondary | ICD-10-CM | POA: Diagnosis not present

## 2021-02-19 ENCOUNTER — Other Ambulatory Visit (HOSPITAL_COMMUNITY): Payer: Self-pay | Admitting: Family Medicine

## 2021-02-19 DIAGNOSIS — E119 Type 2 diabetes mellitus without complications: Secondary | ICD-10-CM | POA: Diagnosis not present

## 2021-02-19 DIAGNOSIS — I10 Essential (primary) hypertension: Secondary | ICD-10-CM | POA: Diagnosis not present

## 2021-02-19 DIAGNOSIS — K219 Gastro-esophageal reflux disease without esophagitis: Secondary | ICD-10-CM | POA: Diagnosis not present

## 2021-02-19 DIAGNOSIS — E1165 Type 2 diabetes mellitus with hyperglycemia: Secondary | ICD-10-CM | POA: Diagnosis not present

## 2021-02-20 ENCOUNTER — Other Ambulatory Visit (HOSPITAL_COMMUNITY): Payer: Self-pay | Admitting: Family Medicine

## 2021-02-20 DIAGNOSIS — R928 Other abnormal and inconclusive findings on diagnostic imaging of breast: Secondary | ICD-10-CM

## 2021-03-04 ENCOUNTER — Inpatient Hospital Stay (HOSPITAL_COMMUNITY): Admission: RE | Admit: 2021-03-04 | Payer: Medicare HMO | Source: Ambulatory Visit

## 2021-03-04 ENCOUNTER — Ambulatory Visit (HOSPITAL_COMMUNITY): Payer: Medicare HMO

## 2021-03-12 ENCOUNTER — Ambulatory Visit (HOSPITAL_COMMUNITY)
Admission: RE | Admit: 2021-03-12 | Discharge: 2021-03-12 | Disposition: A | Discharge status: Home / Self Care | Payer: Medicare HMO | Source: Ambulatory Visit | Attending: Family Medicine | Admitting: Family Medicine

## 2021-03-12 ENCOUNTER — Other Ambulatory Visit: Payer: Self-pay

## 2021-03-12 DIAGNOSIS — R928 Other abnormal and inconclusive findings on diagnostic imaging of breast: Secondary | ICD-10-CM

## 2021-03-12 DIAGNOSIS — N6489 Other specified disorders of breast: Secondary | ICD-10-CM | POA: Diagnosis not present

## 2021-03-12 DIAGNOSIS — R922 Inconclusive mammogram: Secondary | ICD-10-CM | POA: Diagnosis not present

## 2021-03-17 ENCOUNTER — Encounter: Payer: Self-pay | Admitting: Gastroenterology

## 2021-03-17 ENCOUNTER — Ambulatory Visit: Payer: Medicare HMO | Admitting: Gastroenterology

## 2021-03-21 DIAGNOSIS — E1165 Type 2 diabetes mellitus with hyperglycemia: Secondary | ICD-10-CM | POA: Diagnosis not present

## 2021-03-21 DIAGNOSIS — E785 Hyperlipidemia, unspecified: Secondary | ICD-10-CM | POA: Diagnosis not present

## 2021-03-21 DIAGNOSIS — K219 Gastro-esophageal reflux disease without esophagitis: Secondary | ICD-10-CM | POA: Diagnosis not present

## 2021-03-21 DIAGNOSIS — I10 Essential (primary) hypertension: Secondary | ICD-10-CM | POA: Diagnosis not present

## 2021-03-31 ENCOUNTER — Ambulatory Visit (HOSPITAL_COMMUNITY)
Admission: RE | Admit: 2021-03-31 | Discharge: 2021-03-31 | Disposition: A | Discharge status: Home / Self Care | Payer: Medicare HMO | Source: Ambulatory Visit | Attending: Family Medicine | Admitting: Family Medicine

## 2021-03-31 ENCOUNTER — Other Ambulatory Visit: Payer: Self-pay

## 2021-03-31 ENCOUNTER — Other Ambulatory Visit (HOSPITAL_COMMUNITY): Payer: Self-pay | Admitting: Family Medicine

## 2021-03-31 DIAGNOSIS — R000 Tachycardia, unspecified: Secondary | ICD-10-CM | POA: Diagnosis not present

## 2021-03-31 DIAGNOSIS — M7989 Other specified soft tissue disorders: Secondary | ICD-10-CM

## 2021-03-31 DIAGNOSIS — E1165 Type 2 diabetes mellitus with hyperglycemia: Secondary | ICD-10-CM | POA: Diagnosis not present

## 2021-03-31 DIAGNOSIS — L03116 Cellulitis of left lower limb: Secondary | ICD-10-CM | POA: Diagnosis not present

## 2021-03-31 DIAGNOSIS — L0390 Cellulitis, unspecified: Secondary | ICD-10-CM | POA: Diagnosis not present

## 2021-03-31 DIAGNOSIS — L299 Pruritus, unspecified: Secondary | ICD-10-CM | POA: Diagnosis not present

## 2021-04-21 DIAGNOSIS — I10 Essential (primary) hypertension: Secondary | ICD-10-CM | POA: Diagnosis not present

## 2021-04-21 DIAGNOSIS — E782 Mixed hyperlipidemia: Secondary | ICD-10-CM | POA: Diagnosis not present

## 2021-05-12 DIAGNOSIS — I10 Essential (primary) hypertension: Secondary | ICD-10-CM | POA: Diagnosis not present

## 2021-05-12 DIAGNOSIS — E119 Type 2 diabetes mellitus without complications: Secondary | ICD-10-CM | POA: Diagnosis not present

## 2021-05-14 DIAGNOSIS — K219 Gastro-esophageal reflux disease without esophagitis: Secondary | ICD-10-CM | POA: Diagnosis not present

## 2021-05-14 DIAGNOSIS — R69 Illness, unspecified: Secondary | ICD-10-CM | POA: Diagnosis not present

## 2021-05-14 DIAGNOSIS — N1831 Chronic kidney disease, stage 3a: Secondary | ICD-10-CM | POA: Diagnosis not present

## 2021-05-14 DIAGNOSIS — E669 Obesity, unspecified: Secondary | ICD-10-CM | POA: Diagnosis not present

## 2021-05-14 DIAGNOSIS — E1165 Type 2 diabetes mellitus with hyperglycemia: Secondary | ICD-10-CM | POA: Diagnosis not present

## 2021-05-14 DIAGNOSIS — E782 Mixed hyperlipidemia: Secondary | ICD-10-CM | POA: Diagnosis not present

## 2021-05-14 DIAGNOSIS — Z0001 Encounter for general adult medical examination with abnormal findings: Secondary | ICD-10-CM | POA: Diagnosis not present

## 2021-05-14 DIAGNOSIS — G894 Chronic pain syndrome: Secondary | ICD-10-CM | POA: Diagnosis not present

## 2021-05-14 DIAGNOSIS — E875 Hyperkalemia: Secondary | ICD-10-CM | POA: Diagnosis not present

## 2021-05-14 DIAGNOSIS — I10 Essential (primary) hypertension: Secondary | ICD-10-CM | POA: Diagnosis not present

## 2021-05-21 DIAGNOSIS — I10 Essential (primary) hypertension: Secondary | ICD-10-CM | POA: Diagnosis not present

## 2021-05-21 DIAGNOSIS — E782 Mixed hyperlipidemia: Secondary | ICD-10-CM | POA: Diagnosis not present

## 2021-06-20 DIAGNOSIS — I10 Essential (primary) hypertension: Secondary | ICD-10-CM | POA: Diagnosis not present

## 2021-06-20 DIAGNOSIS — E782 Mixed hyperlipidemia: Secondary | ICD-10-CM | POA: Diagnosis not present

## 2021-07-03 DIAGNOSIS — E1136 Type 2 diabetes mellitus with diabetic cataract: Secondary | ICD-10-CM | POA: Diagnosis not present

## 2021-07-03 DIAGNOSIS — H524 Presbyopia: Secondary | ICD-10-CM | POA: Diagnosis not present

## 2021-07-03 DIAGNOSIS — H5213 Myopia, bilateral: Secondary | ICD-10-CM | POA: Diagnosis not present

## 2021-07-03 DIAGNOSIS — H2513 Age-related nuclear cataract, bilateral: Secondary | ICD-10-CM | POA: Diagnosis not present

## 2021-07-20 DIAGNOSIS — I10 Essential (primary) hypertension: Secondary | ICD-10-CM | POA: Diagnosis not present

## 2021-07-20 DIAGNOSIS — E785 Hyperlipidemia, unspecified: Secondary | ICD-10-CM | POA: Diagnosis not present

## 2021-07-22 DIAGNOSIS — E785 Hyperlipidemia, unspecified: Secondary | ICD-10-CM | POA: Diagnosis not present

## 2021-07-22 DIAGNOSIS — I10 Essential (primary) hypertension: Secondary | ICD-10-CM | POA: Diagnosis not present

## 2021-08-19 DIAGNOSIS — E119 Type 2 diabetes mellitus without complications: Secondary | ICD-10-CM | POA: Diagnosis not present

## 2021-08-19 DIAGNOSIS — I10 Essential (primary) hypertension: Secondary | ICD-10-CM | POA: Diagnosis not present

## 2021-08-20 ENCOUNTER — Other Ambulatory Visit (HOSPITAL_COMMUNITY): Payer: Self-pay | Admitting: Family Medicine

## 2021-08-20 DIAGNOSIS — N951 Menopausal and female climacteric states: Secondary | ICD-10-CM | POA: Diagnosis not present

## 2021-08-20 DIAGNOSIS — G894 Chronic pain syndrome: Secondary | ICD-10-CM | POA: Diagnosis not present

## 2021-08-20 DIAGNOSIS — G2581 Restless legs syndrome: Secondary | ICD-10-CM | POA: Diagnosis not present

## 2021-08-20 DIAGNOSIS — I10 Essential (primary) hypertension: Secondary | ICD-10-CM | POA: Diagnosis not present

## 2021-08-20 DIAGNOSIS — Z1231 Encounter for screening mammogram for malignant neoplasm of breast: Secondary | ICD-10-CM

## 2021-08-20 DIAGNOSIS — R69 Illness, unspecified: Secondary | ICD-10-CM | POA: Diagnosis not present

## 2021-09-19 DIAGNOSIS — E119 Type 2 diabetes mellitus without complications: Secondary | ICD-10-CM | POA: Diagnosis not present

## 2021-09-19 DIAGNOSIS — I10 Essential (primary) hypertension: Secondary | ICD-10-CM | POA: Diagnosis not present

## 2021-10-19 DIAGNOSIS — E782 Mixed hyperlipidemia: Secondary | ICD-10-CM | POA: Diagnosis not present

## 2021-10-19 DIAGNOSIS — N1831 Chronic kidney disease, stage 3a: Secondary | ICD-10-CM | POA: Diagnosis not present

## 2021-10-19 DIAGNOSIS — E1165 Type 2 diabetes mellitus with hyperglycemia: Secondary | ICD-10-CM | POA: Diagnosis not present

## 2021-10-19 DIAGNOSIS — I129 Hypertensive chronic kidney disease with stage 1 through stage 4 chronic kidney disease, or unspecified chronic kidney disease: Secondary | ICD-10-CM | POA: Diagnosis not present

## 2021-11-05 DIAGNOSIS — E119 Type 2 diabetes mellitus without complications: Secondary | ICD-10-CM | POA: Diagnosis not present

## 2021-11-05 DIAGNOSIS — I10 Essential (primary) hypertension: Secondary | ICD-10-CM | POA: Diagnosis not present

## 2021-11-10 DIAGNOSIS — R69 Illness, unspecified: Secondary | ICD-10-CM | POA: Diagnosis not present

## 2021-11-10 DIAGNOSIS — G894 Chronic pain syndrome: Secondary | ICD-10-CM | POA: Diagnosis not present

## 2021-11-10 DIAGNOSIS — N1831 Chronic kidney disease, stage 3a: Secondary | ICD-10-CM | POA: Diagnosis not present

## 2021-11-10 DIAGNOSIS — E1165 Type 2 diabetes mellitus with hyperglycemia: Secondary | ICD-10-CM | POA: Diagnosis not present

## 2021-11-10 DIAGNOSIS — K219 Gastro-esophageal reflux disease without esophagitis: Secondary | ICD-10-CM | POA: Diagnosis not present

## 2021-11-10 DIAGNOSIS — G2581 Restless legs syndrome: Secondary | ICD-10-CM | POA: Diagnosis not present

## 2021-11-10 DIAGNOSIS — J45909 Unspecified asthma, uncomplicated: Secondary | ICD-10-CM | POA: Diagnosis not present

## 2021-11-10 DIAGNOSIS — E669 Obesity, unspecified: Secondary | ICD-10-CM | POA: Diagnosis not present

## 2021-11-10 DIAGNOSIS — F32A Depression, unspecified: Secondary | ICD-10-CM | POA: Diagnosis not present

## 2021-11-10 DIAGNOSIS — I10 Essential (primary) hypertension: Secondary | ICD-10-CM | POA: Diagnosis not present

## 2021-11-10 DIAGNOSIS — Z930 Tracheostomy status: Secondary | ICD-10-CM | POA: Diagnosis not present

## 2021-11-10 DIAGNOSIS — E782 Mixed hyperlipidemia: Secondary | ICD-10-CM | POA: Diagnosis not present

## 2021-11-12 ENCOUNTER — Other Ambulatory Visit (HOSPITAL_COMMUNITY): Payer: Self-pay | Admitting: Family Medicine

## 2021-11-12 DIAGNOSIS — N6489 Other specified disorders of breast: Secondary | ICD-10-CM

## 2021-11-18 ENCOUNTER — Inpatient Hospital Stay (HOSPITAL_COMMUNITY): Admission: RE | Admit: 2021-11-18 | Payer: Medicare HMO | Source: Ambulatory Visit

## 2021-11-18 ENCOUNTER — Ambulatory Visit (HOSPITAL_COMMUNITY): Admission: RE | Admit: 2021-11-18 | Payer: Medicare HMO | Source: Ambulatory Visit

## 2021-11-18 ENCOUNTER — Encounter (HOSPITAL_COMMUNITY): Payer: Self-pay

## 2021-11-18 ENCOUNTER — Ambulatory Visit (HOSPITAL_COMMUNITY): Payer: Medicare HMO

## 2021-11-19 DIAGNOSIS — I10 Essential (primary) hypertension: Secondary | ICD-10-CM | POA: Diagnosis not present

## 2021-12-08 DIAGNOSIS — J45909 Unspecified asthma, uncomplicated: Secondary | ICD-10-CM | POA: Diagnosis not present

## 2021-12-08 DIAGNOSIS — N189 Chronic kidney disease, unspecified: Secondary | ICD-10-CM | POA: Diagnosis not present

## 2021-12-08 DIAGNOSIS — R69 Illness, unspecified: Secondary | ICD-10-CM | POA: Diagnosis not present

## 2021-12-08 DIAGNOSIS — I10 Essential (primary) hypertension: Secondary | ICD-10-CM | POA: Diagnosis not present

## 2021-12-08 DIAGNOSIS — G894 Chronic pain syndrome: Secondary | ICD-10-CM | POA: Diagnosis not present

## 2021-12-08 DIAGNOSIS — E119 Type 2 diabetes mellitus without complications: Secondary | ICD-10-CM | POA: Diagnosis not present

## 2021-12-19 DIAGNOSIS — E1165 Type 2 diabetes mellitus with hyperglycemia: Secondary | ICD-10-CM | POA: Diagnosis not present

## 2021-12-19 DIAGNOSIS — N1831 Chronic kidney disease, stage 3a: Secondary | ICD-10-CM | POA: Diagnosis not present

## 2021-12-19 DIAGNOSIS — E782 Mixed hyperlipidemia: Secondary | ICD-10-CM | POA: Diagnosis not present

## 2021-12-19 DIAGNOSIS — I129 Hypertensive chronic kidney disease with stage 1 through stage 4 chronic kidney disease, or unspecified chronic kidney disease: Secondary | ICD-10-CM | POA: Diagnosis not present

## 2022-01-19 DIAGNOSIS — E782 Mixed hyperlipidemia: Secondary | ICD-10-CM | POA: Diagnosis not present

## 2022-01-19 DIAGNOSIS — N1831 Chronic kidney disease, stage 3a: Secondary | ICD-10-CM | POA: Diagnosis not present

## 2022-01-19 DIAGNOSIS — I129 Hypertensive chronic kidney disease with stage 1 through stage 4 chronic kidney disease, or unspecified chronic kidney disease: Secondary | ICD-10-CM | POA: Diagnosis not present

## 2022-01-19 DIAGNOSIS — E1165 Type 2 diabetes mellitus with hyperglycemia: Secondary | ICD-10-CM | POA: Diagnosis not present

## 2022-02-11 DIAGNOSIS — R7301 Impaired fasting glucose: Secondary | ICD-10-CM | POA: Diagnosis not present

## 2022-02-11 DIAGNOSIS — E669 Obesity, unspecified: Secondary | ICD-10-CM | POA: Diagnosis not present

## 2022-02-18 DIAGNOSIS — Z1211 Encounter for screening for malignant neoplasm of colon: Secondary | ICD-10-CM | POA: Diagnosis not present

## 2022-02-18 DIAGNOSIS — J45909 Unspecified asthma, uncomplicated: Secondary | ICD-10-CM | POA: Diagnosis not present

## 2022-02-18 DIAGNOSIS — Z1231 Encounter for screening mammogram for malignant neoplasm of breast: Secondary | ICD-10-CM | POA: Diagnosis not present

## 2022-02-18 DIAGNOSIS — R69 Illness, unspecified: Secondary | ICD-10-CM | POA: Diagnosis not present

## 2022-02-18 DIAGNOSIS — G894 Chronic pain syndrome: Secondary | ICD-10-CM | POA: Diagnosis not present

## 2022-02-18 DIAGNOSIS — E119 Type 2 diabetes mellitus without complications: Secondary | ICD-10-CM | POA: Diagnosis not present

## 2022-02-18 DIAGNOSIS — I10 Essential (primary) hypertension: Secondary | ICD-10-CM | POA: Diagnosis not present

## 2022-02-18 DIAGNOSIS — N189 Chronic kidney disease, unspecified: Secondary | ICD-10-CM | POA: Diagnosis not present

## 2022-02-18 DIAGNOSIS — Z6824 Body mass index (BMI) 24.0-24.9, adult: Secondary | ICD-10-CM | POA: Diagnosis not present

## 2022-03-03 ENCOUNTER — Encounter: Payer: Self-pay | Admitting: *Deleted

## 2022-03-17 ENCOUNTER — Ambulatory Visit (HOSPITAL_COMMUNITY)
Admission: RE | Admit: 2022-03-17 | Discharge: 2022-03-17 | Disposition: A | Discharge status: Home / Self Care | Payer: Medicare HMO | Source: Ambulatory Visit | Attending: Family Medicine | Admitting: Family Medicine

## 2022-03-17 DIAGNOSIS — N6489 Other specified disorders of breast: Secondary | ICD-10-CM

## 2022-03-17 DIAGNOSIS — R928 Other abnormal and inconclusive findings on diagnostic imaging of breast: Secondary | ICD-10-CM | POA: Diagnosis not present

## 2022-06-18 DIAGNOSIS — E785 Hyperlipidemia, unspecified: Secondary | ICD-10-CM | POA: Diagnosis not present

## 2022-06-18 DIAGNOSIS — I10 Essential (primary) hypertension: Secondary | ICD-10-CM | POA: Diagnosis not present

## 2022-06-18 DIAGNOSIS — E119 Type 2 diabetes mellitus without complications: Secondary | ICD-10-CM | POA: Diagnosis not present

## 2022-06-25 DIAGNOSIS — Z79899 Other long term (current) drug therapy: Secondary | ICD-10-CM | POA: Diagnosis not present

## 2022-06-25 DIAGNOSIS — Z794 Long term (current) use of insulin: Secondary | ICD-10-CM | POA: Diagnosis not present

## 2022-06-25 DIAGNOSIS — M5440 Lumbago with sciatica, unspecified side: Secondary | ICD-10-CM | POA: Diagnosis not present

## 2022-06-25 DIAGNOSIS — Z0001 Encounter for general adult medical examination with abnormal findings: Secondary | ICD-10-CM | POA: Diagnosis not present

## 2022-06-25 DIAGNOSIS — Z0000 Encounter for general adult medical examination without abnormal findings: Secondary | ICD-10-CM | POA: Diagnosis not present

## 2022-06-25 DIAGNOSIS — B372 Candidiasis of skin and nail: Secondary | ICD-10-CM | POA: Diagnosis not present

## 2022-06-25 DIAGNOSIS — E1122 Type 2 diabetes mellitus with diabetic chronic kidney disease: Secondary | ICD-10-CM | POA: Diagnosis not present

## 2022-06-25 DIAGNOSIS — N189 Chronic kidney disease, unspecified: Secondary | ICD-10-CM | POA: Diagnosis not present

## 2022-06-25 DIAGNOSIS — G894 Chronic pain syndrome: Secondary | ICD-10-CM | POA: Diagnosis not present

## 2022-06-25 DIAGNOSIS — R69 Illness, unspecified: Secondary | ICD-10-CM | POA: Diagnosis not present

## 2022-06-25 DIAGNOSIS — J45909 Unspecified asthma, uncomplicated: Secondary | ICD-10-CM | POA: Diagnosis not present

## 2022-06-25 DIAGNOSIS — I10 Essential (primary) hypertension: Secondary | ICD-10-CM | POA: Diagnosis not present

## 2022-07-02 DIAGNOSIS — Z1211 Encounter for screening for malignant neoplasm of colon: Secondary | ICD-10-CM | POA: Diagnosis not present

## 2022-07-13 DIAGNOSIS — G894 Chronic pain syndrome: Secondary | ICD-10-CM | POA: Diagnosis not present

## 2022-07-13 DIAGNOSIS — M5441 Lumbago with sciatica, right side: Secondary | ICD-10-CM | POA: Diagnosis not present

## 2022-07-21 DIAGNOSIS — M5416 Radiculopathy, lumbar region: Secondary | ICD-10-CM | POA: Diagnosis not present

## 2022-07-27 ENCOUNTER — Other Ambulatory Visit: Payer: Self-pay | Admitting: Neurological Surgery

## 2022-07-27 DIAGNOSIS — M5416 Radiculopathy, lumbar region: Secondary | ICD-10-CM

## 2022-08-17 ENCOUNTER — Other Ambulatory Visit (HOSPITAL_COMMUNITY): Payer: Self-pay | Admitting: Family Medicine

## 2022-08-17 DIAGNOSIS — N6489 Other specified disorders of breast: Secondary | ICD-10-CM

## 2022-08-21 ENCOUNTER — Encounter: Payer: Self-pay | Admitting: *Deleted

## 2022-08-25 ENCOUNTER — Ambulatory Visit
Admission: RE | Admit: 2022-08-25 | Discharge: 2022-08-25 | Disposition: A | Discharge status: Home / Self Care | Payer: Medicare HMO | Source: Ambulatory Visit | Attending: Neurological Surgery | Admitting: Neurological Surgery

## 2022-08-25 DIAGNOSIS — M5116 Intervertebral disc disorders with radiculopathy, lumbar region: Secondary | ICD-10-CM | POA: Diagnosis not present

## 2022-08-25 DIAGNOSIS — M5416 Radiculopathy, lumbar region: Secondary | ICD-10-CM

## 2022-08-25 MED ORDER — GADOPICLENOL 0.5 MMOL/ML IV SOLN
8.0000 mL | Freq: Once | INTRAVENOUS | Status: AC | PRN
  Administered 2022-08-25: 8 mL via INTRAVENOUS

## 2022-09-10 DIAGNOSIS — M5416 Radiculopathy, lumbar region: Secondary | ICD-10-CM | POA: Diagnosis not present

## 2022-09-11 ENCOUNTER — Other Ambulatory Visit: Payer: Self-pay | Admitting: Neurological Surgery

## 2022-09-22 NOTE — Progress Notes (Signed)
Surgical Instructions    Your procedure is scheduled on Monday, September 28, 2022.  Report to [facility] Main Entrance "A" at 1:30 A.M., then check in with the Admitting office.  Call this number if you have problems the morning of surgery:  [phone]   If you have any questions prior to your surgery date call [phone]: Open Monday-Friday 8am-4pm If you experience any cold or flu symptoms such as cough, fever, chills, shortness of breath, etc. between now and your scheduled surgery, please notify us at the above number     Remember:  Do not eat after midnight the night before your surgery  You may drink clear liquids until 12:30 the morning of your surgery.   Clear liquids allowed are: Water, Non-Citrus Juices (without pulp), Carbonated Beverages, Clear Tea, Black Coffee ONLY (NO MILK, CREAM OR POWDERED CREAMER of any kind), and Gatorade    Take these medicines the morning of surgery with A SIP OF WATER:  rosuvastatin (CRESTOR)   If needed:  albuterol (PROVENTIL) Nebulizer albuterol (VENTOLIN HFA) Inhaler  ALPRAZolam (XANAX)  cyclobenzaprine (FLEXERIL)  HYDROcodone-acetaminophen (NORCO)   As of today, STOP taking any Aspirin (unless otherwise instructed by your surgeon) Aleve, Naproxen, Ibuprofen, Motrin, Advil, Goody's, BC's, all herbal medications, fish oil, and all vitamins.   WHAT DO I DO ABOUT MY DIABETES MEDICATION?   Do not take oral diabetes medicines (pills) the morning of surgery    The day of surgery, do not take other diabetes injectables, including Byetta (exenatide), Bydureon (exenatide ER), Victoza (liraglutide), or Trulicity (dulaglutide).  If your CBG is greater than 220 mg/dL, you may take  of your sliding scale (correction) dose of insulin.   HOW TO MANAGE YOUR DIABETES BEFORE AND AFTER SURGERY  Why is it important to control my blood sugar before and after surgery? Improving blood sugar levels before and after surgery helps healing and can limit  problems. A way of improving blood sugar control is eating a healthy diet by:  Eating less sugar and carbohydrates  Increasing activity/exercise  Talking with your doctor about reaching your blood sugar goals High blood sugars (greater than 180 mg/dL) can raise your risk of infections and slow your recovery, so you will need to focus on controlling your diabetes during the weeks before surgery. Make sure that the doctor who takes care of your diabetes knows about your planned surgery including the date and location.  How do I manage my blood sugar before surgery? Check your blood sugar at least 4 times a day, starting 2 days before surgery, to make sure that the level is not too high or low.  Check your blood sugar the morning of your surgery when you wake up and every 2 hours until you get to the Short Stay unit.  If your blood sugar is less than 70 mg/dL, you will need to treat for low blood sugar: Do not take insulin. Treat a low blood sugar (less than 70 mg/dL) with  cup of clear juice (cranberry or apple), 4 glucose tablets, OR glucose gel. Recheck blood sugar in 15 minutes after treatment (to make sure it is greater than 70 mg/dL). If your blood sugar is not greater than 70 mg/dL on recheck, call [phone] for further instructions. Report your blood sugar to the short stay nurse when you get to Short Stay.  If you are admitted to the hospital after surgery: Your blood sugar will be checked by the staff and you will probably be given insulin after  surgery (instead of oral diabetes medicines) to make sure you have good blood sugar levels. The goal for blood sugar control after surgery is 80-180 mg/dL.   SURGICAL WAITING ROOM VISITATION Patients having surgery or a procedure may have no more than 2 support people in the waiting area - these visitors may rotate.   Children under the age of 35 must have an adult with them who is not the patient. If the patient needs to stay at the  hospital during part of their recovery, the visitor guidelines for inpatient rooms apply. Pre-op nurse will coordinate an appropriate time for 1 support person to accompany patient in pre-op.  This support person may not rotate.   Please refer to [email or web address].hu for the visitor guidelines for Inpatients (after your surgery is over and you are in a regular room).    Special instructions:    Oral Hygiene is also important to reduce your risk of infection.  Remember - BRUSH YOUR TEETH THE MORNING OF SURGERY WITH YOUR REGULAR TOOTHPASTE   [place]- Preparing For Surgery  Before surgery, you can play an important role. Because skin is not sterile, your skin needs to be as free of germs as possible. You can reduce the number of germs on your skin by washing with CHG (chlorahexidine gluconate) Soap before surgery.  CHG is an antiseptic cleaner which kills germs and bonds with the skin to continue killing germs even after washing.     Please do not use if you have an allergy to CHG or antibacterial soaps. If your skin becomes reddened/irritated stop using the CHG.  Do not shave (including legs and underarms) for at least 48 hours prior to first CHG shower. It is OK to shave your face.  Day of Surgery:  Take a shower with CHG soap. Wear Clean/Comfortable clothing the morning of surgery Do not apply any deodorants/lotions.   Remember to brush your teeth WITH YOUR REGULAR TOOTHPASTE.  Do not wear jewelry or makeup. Do not wear lotions, powders, perfumes/cologne or deodorant. Do not shave 48 hours prior to surgery.  Men may shave face and neck. Do not bring valuables to the hospital. Do not wear nail polish, gel polish, artificial nails, or any other type of covering on natural nails (fingers and toes) If you have artificial nails or gel coating that need to be removed by a nail salon, please have this removed prior to surgery. Artificial  nails or gel coating may interfere with anesthesia's ability to adequately monitor your vital signs.  [facility] is not responsible for any belongings or valuables.    Do NOT Smoke (Tobacco/Vaping)  24 hours prior to your procedure  If you use a CPAP at night, you may bring your mask for your overnight stay.   Contacts, glasses, hearing aids, dentures or partials may not be worn into surgery, please bring cases for these belongings   For patients admitted to the hospital, discharge time will be determined by your treatment team.   Patients discharged the day of surgery will not be allowed to drive home, and someone needs to stay with them for 24 hours.  If you received a COVID test during your pre-op visit, it is requested that you wear a mask when out in public, stay away from anyone that may not be feeling well, and notify your surgeon if you develop symptoms. If you have been in contact with anyone that has tested positive in the last 10 days, please notify your surgeon.  Please read over the following fact sheets that you were given.

## 2022-09-23 ENCOUNTER — Encounter (HOSPITAL_COMMUNITY)
Admission: RE | Admit: 2022-09-23 | Discharge: 2022-09-23 | Disposition: A | Discharge status: Home / Self Care | Payer: Medicare HMO | Source: Ambulatory Visit | Attending: Neurological Surgery | Admitting: Neurological Surgery

## 2022-09-23 ENCOUNTER — Other Ambulatory Visit: Payer: Self-pay

## 2022-09-23 ENCOUNTER — Encounter (HOSPITAL_COMMUNITY): Payer: Self-pay

## 2022-09-23 VITALS — BP 139/92 | HR 99 | Temp 98.0°F | Resp 18 | Ht 63.5 in | Wt 173.0 lb

## 2022-09-23 DIAGNOSIS — Z01818 Encounter for other preprocedural examination: Secondary | ICD-10-CM | POA: Insufficient documentation

## 2022-09-23 DIAGNOSIS — E119 Type 2 diabetes mellitus without complications: Secondary | ICD-10-CM | POA: Diagnosis not present

## 2022-09-23 DIAGNOSIS — I10 Essential (primary) hypertension: Secondary | ICD-10-CM | POA: Diagnosis not present

## 2022-09-23 HISTORY — DX: Chronic obstructive pulmonary disease, unspecified: J44.9

## 2022-09-23 HISTORY — DX: Gastro-esophageal reflux disease without esophagitis: K21.9

## 2022-09-23 LAB — CBC
HCT: 40 % (ref 36.0–46.0)
Hemoglobin: 13.1 g/dL (ref 12.0–15.0)
MCH: 27.5 pg (ref 26.0–34.0)
MCHC: 32.8 g/dL (ref 30.0–36.0)
MCV: 84 fL (ref 80.0–100.0)
Platelets: 337 10*3/uL (ref 150–400)
RBC: 4.76 MIL/uL (ref 3.87–5.11)
RDW: 14.2 % (ref 11.5–15.5)
WBC: 5.9 10*3/uL (ref 4.0–10.5)
nRBC: 0 % (ref 0.0–0.2)

## 2022-09-23 LAB — BASIC METABOLIC PANEL
Anion gap: 10 (ref 5–15)
BUN: 13 mg/dL (ref 6–20)
CO2: 26 mmol/L (ref 22–32)
Calcium: 8.8 mg/dL — ABNORMAL LOW (ref 8.9–10.3)
Chloride: 98 mmol/L (ref 98–111)
Creatinine, Ser: 1.15 mg/dL — ABNORMAL HIGH (ref 0.44–1.00)
GFR, Estimated: 55 mL/min — ABNORMAL LOW (ref >60)
Glucose, Bld: 128 mg/dL — ABNORMAL HIGH (ref 70–99)
Potassium: 4 mmol/L (ref 3.5–5.1)
Sodium: 134 mmol/L — ABNORMAL LOW (ref 135–145)

## 2022-09-23 LAB — PROTIME-INR
INR: 1 (ref 0.8–1.2)
Prothrombin Time: 12.6 seconds (ref 11.4–15.2)

## 2022-09-23 LAB — SURGICAL PCR SCREEN
MRSA, PCR: NEGATIVE
Staphylococcus aureus: POSITIVE — AB

## 2022-09-23 LAB — GLUCOSE, CAPILLARY: Glucose-Capillary: 157 mg/dL — ABNORMAL HIGH (ref 70–99)

## 2022-09-23 NOTE — Progress Notes (Signed)
PCP - Edwinna Areola. Wall, MD Cardiologist - Denies  PPM/ICD - Denies   Chest x-ray - Denies EKG - 09/23/2022 Stress Test - 05/18/2017 ECHO - 06/03/2017 Cardiac Cath - Denies  Sleep Study - Denies  DM: Type II  Fasting Blood Sugar - 102-108 Checks Blood Sugar as needed. Patient states diabetes is diet controlled. Not taking oral medications or insulin at this time.  Blood Thinner Instructions: N/A Aspirin Instructions: N/A  ERAS Protcol - Yes PRE-SURGERY Ensure or G2- No drink   COVID TEST- N/A   Anesthesia review: No   Patient denies shortness of breath, fever, cough and chest pain at PAT appointment   All instructions explained to the patient, with a verbal understanding of the material. Patient agrees to go over the instructions while at home for a better understanding. The opportunity to ask questions was provided.

## 2022-09-24 LAB — HEMOGLOBIN A1C
Hgb A1c MFr Bld: 6.6 % — ABNORMAL HIGH (ref 4.8–5.6)
Mean Plasma Glucose: 143 mg/dL

## 2022-09-25 NOTE — Progress Notes (Signed)
Pt made aware of surgery time change 09/28/22, 1515-1701, arrival 1315.

## 2022-09-28 ENCOUNTER — Ambulatory Visit (HOSPITAL_COMMUNITY): Payer: Medicare HMO

## 2022-09-28 ENCOUNTER — Encounter (HOSPITAL_COMMUNITY): Payer: Self-pay | Admitting: Neurological Surgery

## 2022-09-28 ENCOUNTER — Encounter (HOSPITAL_COMMUNITY)
Admission: RE | Disposition: A | Discharge status: Home / Self Care | Payer: Self-pay | Source: Home / Self Care | Attending: Neurological Surgery

## 2022-09-28 ENCOUNTER — Ambulatory Visit (HOSPITAL_COMMUNITY): Payer: Medicare HMO | Admitting: Anesthesiology

## 2022-09-28 ENCOUNTER — Ambulatory Visit (HOSPITAL_COMMUNITY)
Admission: RE | Admit: 2022-09-28 | Discharge: 2022-09-28 | Disposition: A | Discharge status: Home / Self Care | Payer: Medicare HMO | Attending: Neurological Surgery | Admitting: Neurological Surgery

## 2022-09-28 DIAGNOSIS — E119 Type 2 diabetes mellitus without complications: Secondary | ICD-10-CM

## 2022-09-28 DIAGNOSIS — Z539 Procedure and treatment not carried out, unspecified reason: Secondary | ICD-10-CM | POA: Insufficient documentation

## 2022-09-28 DIAGNOSIS — M48061 Spinal stenosis, lumbar region without neurogenic claudication: Secondary | ICD-10-CM | POA: Diagnosis present

## 2022-09-28 DIAGNOSIS — R69 Illness, unspecified: Secondary | ICD-10-CM | POA: Diagnosis not present

## 2022-09-28 DIAGNOSIS — F17220 Nicotine dependence, chewing tobacco, uncomplicated: Secondary | ICD-10-CM | POA: Insufficient documentation

## 2022-09-28 LAB — GLUCOSE, CAPILLARY: Glucose-Capillary: 137 mg/dL — ABNORMAL HIGH (ref 70–99)

## 2022-09-28 SURGERY — LUMBAR LAMINECTOMY/DECOMPRESSION MICRODISCECTOMY 1 LEVEL
Anesthesia: General

## 2022-09-28 MED ORDER — LACTATED RINGERS IV SOLN: INTRAVENOUS | Status: DC

## 2022-09-28 MED ORDER — ONDANSETRON HCL 4 MG/2ML IJ SOLN
INTRAMUSCULAR | Status: AC
  Filled 2022-09-28: qty 2

## 2022-09-28 MED ORDER — PROPOFOL 10 MG/ML IV BOLUS
INTRAVENOUS | Status: AC
  Filled 2022-09-28: qty 20

## 2022-09-28 MED ORDER — MIDAZOLAM HCL 2 MG/2ML IJ SOLN
INTRAMUSCULAR | Status: AC
  Filled 2022-09-28: qty 2

## 2022-09-28 MED ORDER — FENTANYL CITRATE (PF) 250 MCG/5ML IJ SOLN
INTRAMUSCULAR | Status: AC
  Filled 2022-09-28: qty 5

## 2022-09-28 MED ORDER — VANCOMYCIN HCL IN DEXTROSE 1-5 GM/200ML-% IV SOLN
1000.0000 mg | INTRAVENOUS | Status: AC
  Administered 2022-09-28: 1000 mg via INTRAVENOUS
  Filled 2022-09-28: qty 200

## 2022-09-28 MED ORDER — CHLORHEXIDINE GLUCONATE CLOTH 2 % EX PADS: 6.0000 | MEDICATED_PAD | Freq: Once | CUTANEOUS | Status: DC

## 2022-09-28 MED ORDER — CHLORHEXIDINE GLUCONATE 0.12 % MT SOLN
15.0000 mL | Freq: Once | OROMUCOSAL | Status: AC
  Administered 2022-09-28: 15 mL via OROMUCOSAL
  Filled 2022-09-28: qty 15

## 2022-09-28 MED ORDER — GABAPENTIN 300 MG PO CAPS
300.0000 mg | ORAL_CAPSULE | ORAL | Status: AC
  Administered 2022-09-28: 300 mg via ORAL
  Filled 2022-09-28: qty 1

## 2022-09-28 MED ORDER — LIDOCAINE 2% (20 MG/ML) 5 ML SYRINGE
INTRAMUSCULAR | Status: AC
  Filled 2022-09-28: qty 5

## 2022-09-28 MED ORDER — DEXAMETHASONE SODIUM PHOSPHATE 10 MG/ML IJ SOLN
INTRAMUSCULAR | Status: AC
  Filled 2022-09-28: qty 1

## 2022-09-28 MED ORDER — ORAL CARE MOUTH RINSE: 15.0000 mL | Freq: Once | OROMUCOSAL | Status: AC

## 2022-09-28 MED ORDER — ROCURONIUM BROMIDE 10 MG/ML (PF) SYRINGE
PREFILLED_SYRINGE | INTRAVENOUS | Status: AC
  Filled 2022-09-28: qty 10

## 2022-09-28 MED ORDER — ACETAMINOPHEN 500 MG PO TABS
1000.0000 mg | ORAL_TABLET | ORAL | Status: AC
  Administered 2022-09-28: 1000 mg via ORAL
  Filled 2022-09-28: qty 2

## 2022-09-28 SURGICAL SUPPLY — 43 items
APL SKNCLS STERI-STRIP NONHPOA (GAUZE/BANDAGES/DRESSINGS) ×1
BAG COUNTER SPONGE SURGICOUNT (BAG) ×2 IMPLANT
BAG SPNG CNTER NS LX DISP (BAG) ×1
BAND INSRT 18 STRL LF DISP RB (MISCELLANEOUS) ×2
BAND RUBBER #18 3X1/16 STRL (MISCELLANEOUS) ×4 IMPLANT
BENZOIN TINCTURE PRP APPL 2/3 (GAUZE/BANDAGES/DRESSINGS) ×2 IMPLANT
BUR CARBIDE MATCH 3.0 (BURR) ×2 IMPLANT
CANISTER SUCT 3000ML PPV (MISCELLANEOUS) ×2 IMPLANT
DRAPE LAPAROTOMY 100X72X124 (DRAPES) ×2 IMPLANT
DRAPE MICROSCOPE SLANT 54X150 (MISCELLANEOUS) ×2 IMPLANT
DRAPE SURG 17X23 STRL (DRAPES) ×2 IMPLANT
DURAPREP 26ML APPLICATOR (WOUND CARE) ×2 IMPLANT
ELECT REM PT RETURN 9FT ADLT (ELECTROSURGICAL) ×1
ELECTRODE REM PT RTRN 9FT ADLT (ELECTROSURGICAL) ×2 IMPLANT
GAUZE 4X4 16PLY ~~LOC~~+RFID DBL (SPONGE) IMPLANT
GLOVE BIO SURGEON STRL SZ7 (GLOVE) IMPLANT
GLOVE BIO SURGEON STRL SZ8 (GLOVE) ×2 IMPLANT
GLOVE BIOGEL PI IND STRL 7.0 (GLOVE) IMPLANT
GOWN STRL REUS W/ TWL LRG LVL3 (GOWN DISPOSABLE) IMPLANT
GOWN STRL REUS W/ TWL XL LVL3 (GOWN DISPOSABLE) ×2 IMPLANT
GOWN STRL REUS W/TWL 2XL LVL3 (GOWN DISPOSABLE) IMPLANT
GOWN STRL REUS W/TWL LRG LVL3 (GOWN DISPOSABLE)
GOWN STRL REUS W/TWL XL LVL3 (GOWN DISPOSABLE) ×1
HEMOSTAT POWDER KIT SURGIFOAM (HEMOSTASIS) ×2 IMPLANT
KIT BASIN OR (CUSTOM PROCEDURE TRAY) ×2 IMPLANT
KIT TURNOVER KIT B (KITS) ×2 IMPLANT
NDL HYPO 25X1 1.5 SAFETY (NEEDLE) ×1 IMPLANT
NDL SPNL 20GX3.5 QUINCKE YW (NEEDLE) IMPLANT
NEEDLE HYPO 25X1 1.5 SAFETY (NEEDLE) ×1 IMPLANT
NEEDLE SPNL 20GX3.5 QUINCKE YW (NEEDLE) IMPLANT
NS IRRIG 1000ML POUR BTL (IV SOLUTION) ×2 IMPLANT
PACK LAMINECTOMY NEURO (CUSTOM PROCEDURE TRAY) ×2 IMPLANT
PAD ARMBOARD 7.5X6 YLW CONV (MISCELLANEOUS) ×6 IMPLANT
SOL ELECTROSURG ANTI STICK (MISCELLANEOUS) ×1
SOLUTION ELECTROSURG ANTI STCK (MISCELLANEOUS) ×2 IMPLANT
STRIP CLOSURE SKIN 1/2X4 (GAUZE/BANDAGES/DRESSINGS) ×2 IMPLANT
SUT VIC AB 0 CT1 18XCR BRD8 (SUTURE) ×2 IMPLANT
SUT VIC AB 0 CT1 8-18 (SUTURE) ×1
SUT VIC AB 2-0 CP2 18 (SUTURE) ×2 IMPLANT
SUT VIC AB 3-0 SH 8-18 (SUTURE) ×2 IMPLANT
TOWEL GREEN STERILE (TOWEL DISPOSABLE) ×2 IMPLANT
TOWEL GREEN STERILE FF (TOWEL DISPOSABLE) ×2 IMPLANT
WATER STERILE IRR 1000ML POUR (IV SOLUTION) ×2 IMPLANT

## 2022-09-28 NOTE — Progress Notes (Signed)
Surgery cancelled per anesthesia 

## 2022-09-28 NOTE — Anesthesia Preprocedure Evaluation (Addendum)
Anesthesia Evaluation  Patient identified by MRN, date of birth, ID band Patient awake    Reviewed: Allergy & Precautions, NPO status , Patient's Chart, lab work & pertinent test results  Airway        Dental   Pulmonary asthma , COPD, neg recent URI          Cardiovascular hypertension,      Neuro/Psych  PSYCHIATRIC DISORDERS Anxiety Depression    negative neurological ROS     GI/Hepatic Neg liver ROS,GERD  ,,  Endo/Other  diabetes    Renal/GU negative Renal ROS     Musculoskeletal  (+) Arthritis ,    Abdominal   Peds  Hematology negative hematology ROS (+)   Anesthesia Other Findings   Reproductive/Obstetrics                             Anesthesia Physical Anesthesia Plan  ASA: 2  Anesthesia Plan: General   Post-op Pain Management: Tylenol PO (pre-op)* and Gabapentin PO (pre-op)*   Induction: Intravenous  PONV Risk Score and Plan: 4 or greater and Ondansetron, Dexamethasone, Treatment may vary due to age or medical condition and Midazolam  Airway Management Planned: Oral ETT  Additional Equipment:   Intra-op Plan:   Post-operative Plan: Extubation in OR  Informed Consent:   Plan Discussed with:   Anesthesia Plan Comments:         Anesthesia Quick Evaluation

## 2022-09-28 NOTE — H&P (Signed)
  Surgery canceled since she came in chewing tobacco. Will reschedule for later this week?

## 2022-10-01 NOTE — Progress Notes (Signed)
SDW. Updated patient with new surgery date of 412/2024, arrival time of noon with ERAS until 1130.  Reviewed medications to take the morning of surgery with no changes being made from previous PAT appointment 09/23/2022. Re-iterated to patient about no tobacco usage. Briefly reviewed hygiene care day of surgery.

## 2022-10-02 ENCOUNTER — Other Ambulatory Visit: Payer: Self-pay

## 2022-10-02 ENCOUNTER — Ambulatory Visit (HOSPITAL_BASED_OUTPATIENT_CLINIC_OR_DEPARTMENT_OTHER): Payer: Medicare HMO | Admitting: Certified Registered Nurse Anesthetist

## 2022-10-02 ENCOUNTER — Encounter (HOSPITAL_COMMUNITY): Payer: Self-pay | Admitting: Neurological Surgery

## 2022-10-02 ENCOUNTER — Ambulatory Visit (HOSPITAL_COMMUNITY): Payer: Medicare HMO

## 2022-10-02 ENCOUNTER — Ambulatory Visit (HOSPITAL_COMMUNITY): Payer: Medicare HMO | Admitting: Certified Registered Nurse Anesthetist

## 2022-10-02 ENCOUNTER — Encounter (HOSPITAL_COMMUNITY)
Admission: RE | Disposition: A | Discharge status: Home / Self Care | Payer: Self-pay | Source: Home / Self Care | Attending: Neurological Surgery

## 2022-10-02 ENCOUNTER — Observation Stay (HOSPITAL_COMMUNITY)
Admission: RE | Admit: 2022-10-02 | Discharge: 2022-10-02 | Disposition: A | Discharge status: Home / Self Care | Payer: Medicare HMO | Attending: Neurological Surgery | Admitting: Neurological Surgery

## 2022-10-02 DIAGNOSIS — I10 Essential (primary) hypertension: Secondary | ICD-10-CM | POA: Diagnosis not present

## 2022-10-02 DIAGNOSIS — M48061 Spinal stenosis, lumbar region without neurogenic claudication: Secondary | ICD-10-CM | POA: Diagnosis not present

## 2022-10-02 DIAGNOSIS — J449 Chronic obstructive pulmonary disease, unspecified: Secondary | ICD-10-CM | POA: Insufficient documentation

## 2022-10-02 DIAGNOSIS — M5116 Intervertebral disc disorders with radiculopathy, lumbar region: Secondary | ICD-10-CM | POA: Diagnosis not present

## 2022-10-02 DIAGNOSIS — Z7984 Long term (current) use of oral hypoglycemic drugs: Secondary | ICD-10-CM

## 2022-10-02 DIAGNOSIS — Z79899 Other long term (current) drug therapy: Secondary | ICD-10-CM | POA: Insufficient documentation

## 2022-10-02 DIAGNOSIS — Z4789 Encounter for other orthopedic aftercare: Secondary | ICD-10-CM | POA: Diagnosis not present

## 2022-10-02 DIAGNOSIS — Z9889 Other specified postprocedural states: Secondary | ICD-10-CM

## 2022-10-02 DIAGNOSIS — R69 Illness, unspecified: Secondary | ICD-10-CM | POA: Diagnosis not present

## 2022-10-02 DIAGNOSIS — E119 Type 2 diabetes mellitus without complications: Secondary | ICD-10-CM

## 2022-10-02 DIAGNOSIS — J45909 Unspecified asthma, uncomplicated: Secondary | ICD-10-CM | POA: Insufficient documentation

## 2022-10-02 DIAGNOSIS — Z981 Arthrodesis status: Secondary | ICD-10-CM | POA: Diagnosis not present

## 2022-10-02 HISTORY — PX: LUMBAR LAMINECTOMY/DECOMPRESSION MICRODISCECTOMY: SHX5026

## 2022-10-02 LAB — GLUCOSE, CAPILLARY
Glucose-Capillary: 127 mg/dL — ABNORMAL HIGH (ref 70–99)
Glucose-Capillary: 153 mg/dL — ABNORMAL HIGH (ref 70–99)

## 2022-10-02 SURGERY — LUMBAR LAMINECTOMY/DECOMPRESSION MICRODISCECTOMY 1 LEVEL
Anesthesia: General | Site: Back | Laterality: Right

## 2022-10-02 MED ORDER — DEXAMETHASONE SODIUM PHOSPHATE 10 MG/ML IJ SOLN
INTRAMUSCULAR | Status: AC
  Filled 2022-10-02: qty 3

## 2022-10-02 MED ORDER — ORAL CARE MOUTH RINSE: 15.0000 mL | Freq: Once | OROMUCOSAL | Status: AC

## 2022-10-02 MED ORDER — PROPOFOL 10 MG/ML IV BOLUS
INTRAVENOUS | Status: DC | PRN
  Administered 2022-10-02: 150 mg via INTRAVENOUS

## 2022-10-02 MED ORDER — ROCURONIUM BROMIDE 10 MG/ML (PF) SYRINGE
PREFILLED_SYRINGE | INTRAVENOUS | Status: DC | PRN
  Administered 2022-10-02: 50 mg via INTRAVENOUS

## 2022-10-02 MED ORDER — KETAMINE HCL 10 MG/ML IJ SOLN
INTRAMUSCULAR | Status: DC | PRN
  Administered 2022-10-02: 20 mg via INTRAVENOUS

## 2022-10-02 MED ORDER — MENTHOL 3 MG MT LOZG: 1.0000 | LOZENGE | OROMUCOSAL | Status: DC | PRN

## 2022-10-02 MED ORDER — OXYCODONE HCL 5 MG PO TABS: 5.0000 mg | ORAL_TABLET | Freq: Once | ORAL | Status: DC | PRN

## 2022-10-02 MED ORDER — ONDANSETRON HCL 4 MG/2ML IJ SOLN
INTRAMUSCULAR | Status: AC
  Filled 2022-10-02: qty 6

## 2022-10-02 MED ORDER — SUGAMMADEX SODIUM 200 MG/2ML IV SOLN
INTRAVENOUS | Status: DC | PRN
  Administered 2022-10-02: 200 mg via INTRAVENOUS

## 2022-10-02 MED ORDER — MORPHINE SULFATE (PF) 2 MG/ML IV SOLN: 2.0000 mg | INTRAVENOUS | Status: DC | PRN

## 2022-10-02 MED ORDER — ACETAMINOPHEN 650 MG RE SUPP: 650.0000 mg | RECTAL | Status: DC | PRN

## 2022-10-02 MED ORDER — FENTANYL CITRATE (PF) 100 MCG/2ML IJ SOLN: 25.0000 ug | INTRAMUSCULAR | Status: DC | PRN

## 2022-10-02 MED ORDER — EPHEDRINE 5 MG/ML INJ
INTRAVENOUS | Status: AC
  Filled 2022-10-02: qty 5

## 2022-10-02 MED ORDER — SENNA 8.6 MG PO TABS: 1.0000 | ORAL_TABLET | Freq: Two times a day (BID) | ORAL | Status: DC

## 2022-10-02 MED ORDER — KETAMINE HCL 50 MG/5ML IJ SOSY
PREFILLED_SYRINGE | INTRAMUSCULAR | Status: AC
  Filled 2022-10-02: qty 5

## 2022-10-02 MED ORDER — SODIUM CHLORIDE 0.9% FLUSH: 3.0000 mL | INTRAVENOUS | Status: DC | PRN

## 2022-10-02 MED ORDER — ONDANSETRON HCL 4 MG/2ML IJ SOLN
INTRAMUSCULAR | Status: DC | PRN
  Administered 2022-10-02: 4 mg via INTRAVENOUS

## 2022-10-02 MED ORDER — ALBUMIN HUMAN 5 % IV SOLN: INTRAVENOUS | Status: DC | PRN

## 2022-10-02 MED ORDER — BUPIVACAINE HCL (PF) 0.25 % IJ SOLN
INTRAMUSCULAR | Status: DC | PRN
  Administered 2022-10-02: 5 mL
  Administered 2022-10-02: 20 mL

## 2022-10-02 MED ORDER — PHENOL 1.4 % MT LIQD: 1.0000 | OROMUCOSAL | Status: DC | PRN

## 2022-10-02 MED ORDER — DEXAMETHASONE SODIUM PHOSPHATE 10 MG/ML IJ SOLN
INTRAMUSCULAR | Status: DC | PRN
  Administered 2022-10-02: 10 mg via INTRAVENOUS

## 2022-10-02 MED ORDER — ACETAMINOPHEN 325 MG PO TABS: 650.0000 mg | ORAL_TABLET | ORAL | Status: DC | PRN

## 2022-10-02 MED ORDER — CYCLOBENZAPRINE HCL 10 MG PO TABS: 10.0000 mg | ORAL_TABLET | Freq: Three times a day (TID) | ORAL | 0 refills | Status: AC | PRN

## 2022-10-02 MED ORDER — VANCOMYCIN HCL IN DEXTROSE 1-5 GM/200ML-% IV SOLN: 1000.0000 mg | Freq: Once | INTRAVENOUS | Status: AC

## 2022-10-02 MED ORDER — ROCURONIUM BROMIDE 10 MG/ML (PF) SYRINGE
PREFILLED_SYRINGE | INTRAVENOUS | Status: AC
  Filled 2022-10-02: qty 30

## 2022-10-02 MED ORDER — LACTATED RINGERS IV SOLN: INTRAVENOUS | Status: DC

## 2022-10-02 MED ORDER — THROMBIN 5000 UNITS EX SOLR
OROMUCOSAL | Status: DC | PRN
  Administered 2022-10-02: 5 mL via TOPICAL

## 2022-10-02 MED ORDER — INSULIN ASPART 100 UNIT/ML IJ SOLN: 0.0000 [IU] | INTRAMUSCULAR | Status: DC | PRN

## 2022-10-02 MED ORDER — HYDROCHLOROTHIAZIDE 25 MG PO TABS: 25.0000 mg | ORAL_TABLET | Freq: Every day | ORAL | Status: DC

## 2022-10-02 MED ORDER — ROPINIROLE HCL 1 MG PO TABS: 2.0000 mg | ORAL_TABLET | Freq: Every day | ORAL | Status: DC

## 2022-10-02 MED ORDER — SODIUM CHLORIDE 0.9% FLUSH: 3.0000 mL | Freq: Two times a day (BID) | INTRAVENOUS | Status: DC

## 2022-10-02 MED ORDER — ALBUTEROL SULFATE (2.5 MG/3ML) 0.083% IN NEBU: 2.5000 mg | INHALATION_SOLUTION | Freq: Four times a day (QID) | RESPIRATORY_TRACT | Status: DC | PRN

## 2022-10-02 MED ORDER — HYDROCODONE-ACETAMINOPHEN 10-325 MG PO TABS: 1.0000 | ORAL_TABLET | Freq: Three times a day (TID) | ORAL | Status: DC | PRN

## 2022-10-02 MED ORDER — TELMISARTAN-HCTZ 80-25 MG PO TABS: 1.0000 | ORAL_TABLET | Freq: Every day | ORAL | Status: DC

## 2022-10-02 MED ORDER — BUPIVACAINE HCL (PF) 0.25 % IJ SOLN
INTRAMUSCULAR | Status: AC
  Filled 2022-10-02: qty 30

## 2022-10-02 MED ORDER — LACTATED RINGERS IV SOLN: INTRAVENOUS | Status: DC | PRN

## 2022-10-02 MED ORDER — MIDAZOLAM HCL 2 MG/2ML IJ SOLN
INTRAMUSCULAR | Status: DC | PRN
  Administered 2022-10-02: 2 mg via INTRAVENOUS

## 2022-10-02 MED ORDER — CHLORHEXIDINE GLUCONATE 0.12 % MT SOLN
OROMUCOSAL | Status: AC
  Administered 2022-10-02: 15 mL via OROMUCOSAL
  Filled 2022-10-02: qty 15

## 2022-10-02 MED ORDER — AMISULPRIDE (ANTIEMETIC) 5 MG/2ML IV SOLN: 10.0000 mg | Freq: Once | INTRAVENOUS | Status: DC | PRN

## 2022-10-02 MED ORDER — VANCOMYCIN HCL IN DEXTROSE 1-5 GM/200ML-% IV SOLN: 1000.0000 mg | Freq: Once | INTRAVENOUS | Status: DC

## 2022-10-02 MED ORDER — FENTANYL CITRATE (PF) 250 MCG/5ML IJ SOLN
INTRAMUSCULAR | Status: AC
  Filled 2022-10-02: qty 5

## 2022-10-02 MED ORDER — POTASSIUM CHLORIDE IN NACL 20-0.9 MEQ/L-% IV SOLN: INTRAVENOUS | Status: DC

## 2022-10-02 MED ORDER — THROMBIN 5000 UNITS EX SOLR
CUTANEOUS | Status: AC
  Filled 2022-10-02: qty 5000

## 2022-10-02 MED ORDER — LIDOCAINE 2% (20 MG/ML) 5 ML SYRINGE
INTRAMUSCULAR | Status: DC | PRN
  Administered 2022-10-02: 60 mg via INTRAVENOUS

## 2022-10-02 MED ORDER — ONDANSETRON HCL 4 MG/2ML IJ SOLN: 4.0000 mg | Freq: Four times a day (QID) | INTRAMUSCULAR | Status: DC | PRN

## 2022-10-02 MED ORDER — CYCLOBENZAPRINE HCL 10 MG PO TABS: 10.0000 mg | ORAL_TABLET | Freq: Three times a day (TID) | ORAL | Status: DC | PRN

## 2022-10-02 MED ORDER — PHENYLEPHRINE HCL-NACL 20-0.9 MG/250ML-% IV SOLN
INTRAVENOUS | Status: DC | PRN
  Administered 2022-10-02: 80 ug via INTRAVENOUS
  Administered 2022-10-02 (×2): 160 ug via INTRAVENOUS
  Administered 2022-10-02: 50 ug/min via INTRAVENOUS

## 2022-10-02 MED ORDER — MIDAZOLAM HCL 2 MG/2ML IJ SOLN
INTRAMUSCULAR | Status: AC
  Filled 2022-10-02: qty 2

## 2022-10-02 MED ORDER — VANCOMYCIN HCL IN DEXTROSE 1-5 GM/200ML-% IV SOLN
INTRAVENOUS | Status: AC
  Administered 2022-10-02: 1000 mg via INTRAVENOUS
  Filled 2022-10-02: qty 200

## 2022-10-02 MED ORDER — KETOROLAC TROMETHAMINE 30 MG/ML IJ SOLN: 30.0000 mg | Freq: Once | INTRAMUSCULAR | Status: DC | PRN

## 2022-10-02 MED ORDER — PROMETHAZINE HCL 25 MG/ML IJ SOLN: 6.2500 mg | INTRAMUSCULAR | Status: DC | PRN

## 2022-10-02 MED ORDER — ALBUTEROL SULFATE HFA 108 (90 BASE) MCG/ACT IN AERS: 2.0000 | INHALATION_SPRAY | Freq: Four times a day (QID) | RESPIRATORY_TRACT | Status: DC | PRN

## 2022-10-02 MED ORDER — ONDANSETRON HCL 4 MG PO TABS: 4.0000 mg | ORAL_TABLET | Freq: Four times a day (QID) | ORAL | Status: DC | PRN

## 2022-10-02 MED ORDER — LISINOPRIL-HYDROCHLOROTHIAZIDE 20-25 MG PO TABS: 1.0000 | ORAL_TABLET | Freq: Every day | ORAL | Status: DC

## 2022-10-02 MED ORDER — CELECOXIB 200 MG PO CAPS: 200.0000 mg | ORAL_CAPSULE | Freq: Two times a day (BID) | ORAL | Status: DC

## 2022-10-02 MED ORDER — SODIUM CHLORIDE 0.9 % IV SOLN
250.0000 mL | INTRAVENOUS | Status: DC
  Administered 2022-10-02: 250 mL via INTRAVENOUS

## 2022-10-02 MED ORDER — OXYCODONE HCL 5 MG/5ML PO SOLN: 5.0000 mg | Freq: Once | ORAL | Status: DC | PRN

## 2022-10-02 MED ORDER — LIDOCAINE 2% (20 MG/ML) 5 ML SYRINGE
INTRAMUSCULAR | Status: AC
  Filled 2022-10-02: qty 15

## 2022-10-02 MED ORDER — INSULIN ASPART 100 UNIT/ML IJ SOLN: 0.0000 [IU] | Freq: Three times a day (TID) | INTRAMUSCULAR | Status: DC

## 2022-10-02 MED ORDER — 0.9 % SODIUM CHLORIDE (POUR BTL) OPTIME
TOPICAL | Status: DC | PRN
  Administered 2022-10-02: 1000 mL

## 2022-10-02 MED ORDER — ACETAMINOPHEN 500 MG PO TABS
1000.0000 mg | ORAL_TABLET | Freq: Once | ORAL | Status: AC
  Administered 2022-10-02: 1000 mg via ORAL
  Filled 2022-10-02: qty 2

## 2022-10-02 MED ORDER — PHENYLEPHRINE 80 MCG/ML (10ML) SYRINGE FOR IV PUSH (FOR BLOOD PRESSURE SUPPORT)
PREFILLED_SYRINGE | INTRAVENOUS | Status: AC
  Filled 2022-10-02: qty 10

## 2022-10-02 MED ORDER — HYDROCODONE-ACETAMINOPHEN 10-325 MG PO TABS: 1.0000 | ORAL_TABLET | ORAL | 0 refills | Status: AC | PRN

## 2022-10-02 MED ORDER — IRBESARTAN 150 MG PO TABS: 300.0000 mg | ORAL_TABLET | Freq: Every day | ORAL | Status: DC

## 2022-10-02 MED ORDER — CHLORHEXIDINE GLUCONATE 0.12 % MT SOLN: 15.0000 mL | Freq: Once | OROMUCOSAL | Status: AC

## 2022-10-02 MED ORDER — FENTANYL CITRATE (PF) 250 MCG/5ML IJ SOLN
INTRAMUSCULAR | Status: DC | PRN
  Administered 2022-10-02 (×3): 50 ug via INTRAVENOUS

## 2022-10-02 SURGICAL SUPPLY — 46 items
ADH SKN CLS APL DERMABOND .7 (GAUZE/BANDAGES/DRESSINGS) ×1
APL SKNCLS STERI-STRIP NONHPOA (GAUZE/BANDAGES/DRESSINGS) ×1
BAG COUNTER SPONGE SURGICOUNT (BAG) ×2 IMPLANT
BAG SPNG CNTER NS LX DISP (BAG) ×2
BAND INSRT 18 STRL LF DISP RB (MISCELLANEOUS)
BAND RUBBER #18 3X1/16 STRL (MISCELLANEOUS) ×4 IMPLANT
BENZOIN TINCTURE PRP APPL 2/3 (GAUZE/BANDAGES/DRESSINGS) ×2 IMPLANT
BUR CARBIDE MATCH 3.0 (BURR) ×2 IMPLANT
CANISTER SUCT 3000ML PPV (MISCELLANEOUS) ×2 IMPLANT
DERMABOND ADVANCED .7 DNX12 (GAUZE/BANDAGES/DRESSINGS) IMPLANT
DRAPE LAPAROTOMY 100X72X124 (DRAPES) ×2 IMPLANT
DRAPE MICROSCOPE SLANT 54X150 (MISCELLANEOUS) ×2 IMPLANT
DRAPE SURG 17X23 STRL (DRAPES) ×2 IMPLANT
DRSG OPSITE POSTOP 3X4 (GAUZE/BANDAGES/DRESSINGS) IMPLANT
DURAPREP 26ML APPLICATOR (WOUND CARE) ×2 IMPLANT
ELECT REM PT RETURN 9FT ADLT (ELECTROSURGICAL) ×1
ELECTRODE REM PT RTRN 9FT ADLT (ELECTROSURGICAL) ×2 IMPLANT
GAUZE 4X4 16PLY ~~LOC~~+RFID DBL (SPONGE) IMPLANT
GLOVE BIO SURGEON STRL SZ7 (GLOVE) IMPLANT
GLOVE BIO SURGEON STRL SZ8 (GLOVE) ×2 IMPLANT
GLOVE BIOGEL PI IND STRL 7.0 (GLOVE) IMPLANT
GOWN STRL REUS W/ TWL LRG LVL3 (GOWN DISPOSABLE) IMPLANT
GOWN STRL REUS W/ TWL XL LVL3 (GOWN DISPOSABLE) ×2 IMPLANT
GOWN STRL REUS W/TWL 2XL LVL3 (GOWN DISPOSABLE) IMPLANT
GOWN STRL REUS W/TWL LRG LVL3 (GOWN DISPOSABLE) ×1
GOWN STRL REUS W/TWL XL LVL3 (GOWN DISPOSABLE) ×2
HEMOSTAT POWDER KIT SURGIFOAM (HEMOSTASIS) ×2 IMPLANT
KIT BASIN OR (CUSTOM PROCEDURE TRAY) ×2 IMPLANT
KIT TURNOVER KIT B (KITS) ×2 IMPLANT
NDL HYPO 25X1 1.5 SAFETY (NEEDLE) ×2 IMPLANT
NDL SPNL 20GX3.5 QUINCKE YW (NEEDLE) IMPLANT
NEEDLE HYPO 25X1 1.5 SAFETY (NEEDLE) ×1 IMPLANT
NEEDLE SPNL 20GX3.5 QUINCKE YW (NEEDLE) IMPLANT
NS IRRIG 1000ML POUR BTL (IV SOLUTION) ×2 IMPLANT
PACK LAMINECTOMY NEURO (CUSTOM PROCEDURE TRAY) ×2 IMPLANT
PAD ARMBOARD 7.5X6 YLW CONV (MISCELLANEOUS) ×6 IMPLANT
SOL ELECTROSURG ANTI STICK (MISCELLANEOUS) ×1
SOLUTION ELECTROSURG ANTI STCK (MISCELLANEOUS) ×2 IMPLANT
STRIP CLOSURE SKIN 1/2X4 (GAUZE/BANDAGES/DRESSINGS) ×2 IMPLANT
SUT VIC AB 0 CT1 18XCR BRD8 (SUTURE) ×2 IMPLANT
SUT VIC AB 0 CT1 8-18 (SUTURE) ×1
SUT VIC AB 2-0 CP2 18 (SUTURE) ×2 IMPLANT
SUT VIC AB 3-0 SH 8-18 (SUTURE) ×2 IMPLANT
TOWEL GREEN STERILE (TOWEL DISPOSABLE) ×2 IMPLANT
TOWEL GREEN STERILE FF (TOWEL DISPOSABLE) ×2 IMPLANT
WATER STERILE IRR 1000ML POUR (IV SOLUTION) ×2 IMPLANT

## 2022-10-02 NOTE — H&P (Signed)
Subjective: Patient is a 62 y.o. female admitted for R leg pain. Onset of symptoms was several weeks ago, rapidly worsening since that time.  The pain is rated intense, and is located at the across the lower back and radiates to RLE. The pain is described as aching and occurs all day. The symptoms have been progressive. Symptoms are exacerbated by exercise, standing, and walking for more than a few minutes. MRI or CT showed large recurrent HNP L4-5 R   Past Medical History:  Diagnosis Date   Anxiety    Arthritis    Asthma    COPD (chronic obstructive pulmonary disease)    Diabetes mellitus    GERD (gastroesophageal reflux disease)    Hypertension    Polycystic ovarian syndrome     Past Surgical History:  Procedure Laterality Date   BACK SURGERY  2011   CERVICAL FUSION     CHOLECYSTECTOMY  2020    Prior to Admission medications   Medication Sig Start Date End Date Taking? Authorizing Provider  albuterol (PROVENTIL) (2.5 MG/3ML) 0.083% nebulizer solution Take 2.5 mg by nebulization every 6 (six) hours as needed for wheezing.    [provider]  albuterol (VENTOLIN HFA) 108 (90 Base) MCG/ACT inhaler Inhale 2 puffs into the lungs every 6 (six) hours as needed for wheezing or shortness of breath.    [provider]  ALPRAZolam Prudy Feeler) 0.5 MG tablet Take 1 tablet (0.5 mg total) by mouth at bedtime as needed for anxiety. 04/12/17   Johna Sheriff, MD  azithromycin (ZITHROMAX) 250 MG tablet Take 1 tablet (250 mg total) by mouth daily. Take first 2 tablets together, then 1 every day until finished. Patient not taking: Reported on 09/18/2022 11/11/20   Zadie Rhine, MD  Blood Glucose Monitoring Suppl [facility] VERIO) w/Device KIT 1 kit by Does not apply route 3 (three) times daily. 04/12/17   Johna Sheriff, MD  cyclobenzaprine (FLEXERIL) 10 MG tablet Take 10 mg by mouth 3 (three) times daily as needed for muscle spasms. 03/12/17   [provider]  glimepiride  (AMARYL) 2 MG tablet Take 1 tablet (2 mg total) by mouth daily with breakfast. Patient not taking: Reported on 09/18/2022 04/16/17   Johna Sheriff, MD  glucose blood [facility] VERIO) test strip Use as instructed 04/12/17   Johna Sheriff, MD  glucose blood test strip Use to check blood sugars up to two times a day 12/11/16   Johna Sheriff, MD  HYDROcodone-acetaminophen (NORCO) 10-325 MG tablet Take 1 tablet by mouth every 8 (eight) hours as needed for moderate pain.    [provider]  Lancets [facility] ULTRASOFT) lancets Use as instructed 04/12/17   Johna Sheriff, MD  lisinopril-hydrochlorothiazide (PRINZIDE,ZESTORETIC) 20-25 MG tablet TAKE 1 TABLET BY MOUTH ONCE DAILY 04/12/17   Johna Sheriff, MD  lovastatin (MEVACOR) 20 MG tablet Take 1 tablet (20 mg total) by mouth at bedtime. 04/13/17   Johna Sheriff, MD  rOPINIRole (REQUIP) 2 MG tablet Take 2 mg by mouth at bedtime. 09/05/22   [provider]  rosuvastatin (CRESTOR) 10 MG tablet Take 10 mg by mouth daily. 04/10/20   [provider]  sitaGLIPtin (JANUVIA) 100 MG tablet Take 1 tablet (100 mg total) by mouth daily. Patient not taking: Reported on 09/18/2022 04/12/17   Johna Sheriff, MD  telmisartan-hydrochlorothiazide (MICARDIS HCT) 80-25 MG tablet Take 1 tablet by mouth daily. 09/05/17   [provider]   Allergies  Allergen Reactions  Coconut (Cocos Nucifera)    Penicillins Nausea Only    Has patient had a PCN reaction causing immediate rash, facial/tongue/throat swelling, SOB or lightheadedness with hypotension: No Has patient had a PCN reaction causing severe rash involving mucus membranes or skin necrosis: No Has patient had a PCN reaction that required hospitalization No Has patient had a PCN reaction occurring within the last 10 years: No If all of the above answers are "NO", then may proceed with Cephalosporin use.     Social History   Tobacco Use   Smoking status: Never    Smokeless tobacco: Never  Substance Use Topics   Alcohol use: No    Family History  Problem Relation Age of Onset   Atrial fibrillation Mother    Cancer Mother    Cancer Father      Review of Systems  Positive ROS: neg  All other systems have been reviewed and were otherwise negative with the exception of those mentioned in the HPI and as above.  Objective: Vital signs in last 24 hours: Temp:  [98.4 F (36.9 C)] 98.4 F (36.9 C) (04/12 1209) Pulse Rate:  [76] 76 (04/12 1209) Resp:  [17] 17 (04/12 1209) BP: (117)/(73) 117/73 (04/12 1209) SpO2:  [99 %] 99 % (04/12 1209) Weight:  [78.5 kg] 78.5 kg (04/12 1209)  General Appearance: Alert, cooperative, no distress, appears stated age Head: Normocephalic, without obvious abnormality, atraumatic Eyes: PERRL, conjunctiva/corneas clear, EOM's intact    Neck: Supple, symmetrical, trachea midline Back: Symmetric, no curvature, ROM normal, no CVA tenderness Lungs:  respirations unlabored Heart: Regular rate and rhythm Abdomen: Soft, non-tender Extremities: Extremities normal, atraumatic, no cyanosis or edema Pulses: 2+ and symmetric all extremities Skin: Skin color, texture, turgor normal, no rashes or lesions  NEUROLOGIC:   Mental status: Alert and oriented x4,  no aphasia, good attention span, fund of knowledge, and memory Motor Exam - grossly normal except R DF 4-/5 Sensory Exam - grossly normal Reflexes: 1= Coordination - grossly normal Gait - not tested Balance - not tested Cranial Nerves: I: smell Not tested  II: visual acuity  OS: nl    OD: nl  II: visual fields Full to confrontation  II: pupils Equal, round, reactive to light  III,VII: ptosis None  III,IV,VI: extraocular muscles  Full ROM  V: mastication Normal  V: facial light touch sensation  Normal  V,VII: corneal reflex  Present  VII: facial muscle function - upper  Normal  VII: facial muscle function - lower Normal  VIII: hearing Not tested  IX: soft  palate elevation  Normal  IX,X: gag reflex Present  XI: trapezius strength  5/5  XI: sternocleidomastoid strength 5/5  XI: neck flexion strength  5/5  XII: tongue strength  Normal    Data Review Lab Results  Component Value Date   WBC 5.9 09/23/2022   HGB 13.1 09/23/2022   HCT 40.0 09/23/2022   MCV 84.0 09/23/2022   PLT 337 09/23/2022   Lab Results  Component Value Date   NA 134 (L) 09/23/2022   K 4.0 09/23/2022   CL 98 09/23/2022   CO2 26 09/23/2022   BUN 13 09/23/2022   CREATININE 1.15 (H) 09/23/2022   GLUCOSE 128 (H) 09/23/2022   Lab Results  Component Value Date   INR 1.0 09/23/2022    Assessment/Plan:  Estimated body mass index is 30.16 kg/m as calculated from the following:   Height as of this encounter: 5' 3.5" (1.613 m).   Weight as of  this encounter: 78.5 kg. Patient admitted for re-do R L4-5 microdiskectomy. Patient has failed a reasonable attempt at conservative therapy.  I explained the condition and procedure to the patient and answered any questions.  Patient wishes to proceed with procedure as planned. Understands risks/ benefits and typical outcomes of procedure.   Tia Alert 10/02/2022 12:35 PM

## 2022-10-02 NOTE — Anesthesia Preprocedure Evaluation (Addendum)
Anesthesia Evaluation  Patient identified by MRN, date of birth, ID band Patient awake    Reviewed: Allergy & Precautions, NPO status , Patient's Chart, lab work & pertinent test results  Airway Mallampati: II  TM Distance: >3 FB Neck ROM: Full    Dental  (+) Poor Dentition   Pulmonary asthma , COPD,  COPD inhaler   Pulmonary exam normal        Cardiovascular hypertension, Pt. on medications Normal cardiovascular exam     Neuro/Psych  PSYCHIATRIC DISORDERS Anxiety Depression    negative neurological ROS     GI/Hepatic negative GI ROS,,,(+)     substance abuse    Endo/Other  diabetes, Oral Hypoglycemic Agents    Renal/GU negative Renal ROS     Musculoskeletal  (+) Arthritis ,  narcotic dependent  Abdominal  (+) + obese  Peds  Hematology negative hematology ROS (+)   Anesthesia Other Findings Lumbar Stenosis  Reproductive/Obstetrics                             Anesthesia Physical Anesthesia Plan  ASA: 3  Anesthesia Plan: General   Post-op Pain Management: Ketamine IV*   Induction: Intravenous  PONV Risk Score and Plan: Ondansetron, Dexamethasone, Midazolam and Treatment may vary due to age or medical condition  Airway Management Planned: Oral ETT  Additional Equipment:   Intra-op Plan:   Post-operative Plan: Extubation in OR  Informed Consent: I have reviewed the patients History and Physical, chart, labs and discussed the procedure including the risks, benefits and alternatives for the proposed anesthesia with the patient or authorized representative who has indicated his/her understanding and acceptance.     Dental advisory given  Plan Discussed with: CRNA  Anesthesia Plan Comments:        Anesthesia Quick Evaluation

## 2022-10-02 NOTE — Plan of Care (Signed)
  Problem: Education: Goal: Ability to verbalize activity precautions or restrictions will improve Outcome: Completed/Met Goal: Knowledge of the prescribed therapeutic regimen will improve Outcome: Completed/Met Goal: Understanding of discharge needs will improve Outcome: Completed/Met   Problem: Activity: Goal: Ability to avoid complications of mobility impairment will improve Outcome: Completed/Met Goal: Ability to tolerate increased activity will improve Outcome: Completed/Met Goal: Will remain free from falls Outcome: Completed/Met   Problem: Bowel/Gastric: Goal: Gastrointestinal status for postoperative course will improve Outcome: Completed/Met   Problem: Clinical Measurements: Goal: Ability to maintain clinical measurements within normal limits will improve Outcome: Completed/Met Goal: Postoperative complications will be avoided or minimized Outcome: Completed/Met Goal: Diagnostic test results will improve Outcome: Completed/Met   Problem: Pain Management: Goal: Pain level will decrease Outcome: Completed/Met   Problem: Skin Integrity: Goal: Will show signs of wound healing Outcome: Completed/Met   Problem: Health Behavior/Discharge Planning: Goal: Identification of resources available to assist in meeting health care needs will improve Outcome: Completed/Met   Problem: Bladder/Genitourinary: Goal: Urinary functional status for postoperative course will improve Outcome: Completed/Met Patient alert and oriented, void, ambulate, surgical site clean and dry. D/c instructions explain and given to the patient all questions answered. Pt. D/c home per order.

## 2022-10-02 NOTE — Discharge Summary (Signed)
Physician Discharge Summary  Patient ID: [name] MRN: [id number] DOB/AGE: 1960/09/30 62 y.o.  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 The patient tolerated the procedure well and was taken to the recovery room and then to the floor in stable condition. The hospital course was routine. There were no complications. The wound remained clean dry and intact. Pt had appropriate back soreness. No complaints of leg pain or new N/T/W. The patient remained afebrile with stable vital signs, and tolerated a regular diet. The patient continued to increase activities, and pain was well controlled with oral pain medications.   Consults: None  Significant Diagnostic Studies:  Results for orders placed or performed during the hospital encounter of 10/02/22  Glucose, capillary  Result Value Ref Range   Glucose-Capillary 127 (H) 70 - 99 mg/dL  Glucose, capillary  Result Value Ref Range   Glucose-Capillary 153 (H) 70 - 99 mg/dL    No results found.  Antibiotics:  Anti-infectives (From admission, onward)    Start     Dose/Rate Route Frequency Ordered Stop   10/02/22 1230  vancomycin (VANCOCIN) IVPB 1000 mg/200 mL premix        1,000 mg 200 mL/hr over 60 Minutes Intravenous  Once 10/02/22 1219 10/02/22 1350       Discharge Exam: Blood pressure 124/68, pulse 74, temperature 97.7 F (36.5 C), resp. rate 12, height 5' 3.5" (1.613 m), weight 78.5 kg, SpO2 94 %. Neurologic: Grossly normal Ambulating and voiding well incision cdi   Discharge Medications:   Allergies as of 10/02/2022       Reactions   Coconut (cocos Nucifera)    Penicillins Nausea Only   Has patient had a  PCN reaction causing immediate rash, facial/tongue/throat swelling, SOB or lightheadedness with hypotension: No Has patient had a PCN reaction causing severe rash involving mucus membranes or skin necrosis: No Has patient had a PCN reaction that required hospitalization No Has patient had a PCN reaction occurring within the last 10 years: No If all of the above answers are "NO", then may proceed with Cephalosporin use.        Medication List     STOP taking these medications    azithromycin 250 MG tablet Commonly known as: ZITHROMAX   glimepiride 2 MG tablet Commonly known as: AMARYL   sitaGLIPtin 100 MG tablet Commonly known as: Januvia       TAKE these medications    albuterol (2.5 MG/3ML) 0.083% nebulizer solution Commonly known as: PROVENTIL Take 2.5 mg by nebulization every 6 (six) hours as needed for wheezing.   albuterol 108 (90 Base) MCG/ACT inhaler Commonly known as: VENTOLIN HFA Inhale 2 puffs into the lungs every 6 (six) hours as needed for wheezing or shortness of breath.   ALPRAZolam 0.5 MG tablet Commonly known as: XANAX Take 1 tablet (0.5 mg total) by mouth at bedtime as needed for anxiety.   cyclobenzaprine 10 MG tablet Commonly known as: FLEXERIL Take 1 tablet (10 mg total) by mouth 3 (three) times daily as needed for muscle spasms.   glucose blood test strip Use to check blood sugars up to two times a day   glucose blood test strip Commonly known as: Designer, jewellery  as instructed   HYDROcodone-acetaminophen 10-325 MG tablet Commonly known as: NORCO Take 1 tablet by mouth every 4 (four) hours as needed for moderate pain. What changed: when to take this   lisinopril-hydrochlorothiazide 20-25 MG tablet Commonly known as: ZESTORETIC TAKE 1 TABLET BY MOUTH ONCE DAILY   lovastatin 20 MG tablet Commonly known as: MEVACOR Take 1 tablet (20 mg total) by mouth at bedtime.   onetouch ultrasoft lancets Use as instructed   OneTouch Verio  w/Device Kit 1 kit by Does not apply route 3 (three) times daily.   rOPINIRole 2 MG tablet Commonly known as: REQUIP Take 2 mg by mouth at bedtime.   rosuvastatin 10 MG tablet Commonly known as: CRESTOR Take 10 mg by mouth daily.   telmisartan-hydrochlorothiazide 80-25 MG tablet Commonly known as: MICARDIS HCT Take 1 tablet by mouth daily.        Disposition: home   Final Dx: redo right L4-5 microdiskectomy  Discharge Instructions      Remove dressing in 72 hours   Complete by: As directed    Call MD for:  difficulty breathing, headache or visual disturbances   Complete by: As directed    Call MD for:  persistant nausea and vomiting   Complete by: As directed    Call MD for:  redness, tenderness, or signs of infection (pain, swelling, redness, odor or green/yellow discharge around incision site)   Complete by: As directed    Call MD for:  severe uncontrolled pain   Complete by: As directed    Call MD for:  temperature >100.4   Complete by: As directed    Diet - low sodium heart healthy   Complete by: As directed    Increase activity slowly   Complete by: As directed         Follow-up Information     Tia Alert, MD. Schedule an appointment as soon as possible for a visit in 2 week(s).   Specialty: Neurosurgery Contact information: [address] [place] [phone]                  Signed: Tiana Loft [facility] 10/02/2022, 6:07 PM

## 2022-10-02 NOTE — Op Note (Signed)
10/02/2022  5:19 PM  PATIENT:  [name]  62 y.o. 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  BLOOD ADMINISTERED: none  DRAINS: None  SPECIMEN:  none  INDICATION FOR PROCEDURE: This patient presented with right leg pain with partial right foot drop. Imaging showed recurrent disc herniation L4-5 on the right. The patient tried conservative measures without relief. Pain was debilitating. Recommended redo L4-5 microdiscectomy on the right. Patient understood the risks, benefits, and alternatives and potential outcomes and wished to proceed.  PROCEDURE DETAILS: The patient was taken to the operating room and after induction of adequate generalized endotracheal anesthesia, the patient was rolled into the prone position on the Wilson frame and all pressure points were padded. The lumbar region was cleaned and then prepped with DuraPrep and draped in the usual sterile fashion. 5 cc of local anesthesia was injected and then a dorsal midline incision was made and carried down to the lumbo sacral fascia. The fascia was opened and the paraspinous musculature was taken down in a subperiosteal fashion to expose L4-5 on the right.  We found the scar tissue at the laminar space on the right,  intraoperative x-ray confirmed my level, and then I used a combination of the high-speed drill and the Kerrison punches to perform a hemilaminectomy, medial facetectomy, and foraminotomy at L4 5 on the right. The underlying yellow ligament was opened and removed in a piecemeal fashion to expose the underlying dura and exiting nerve root. I  undercut the lateral recess and dissected down until I was medial to and distal to the pedicle. The nerve root was well decompressed. We then gently retracted the nerve root medially with a retractor, coagulated the epidural venous vasculature, and incised the disc space. I performed a thorough intradiscal discectomy with pituitary rongeurs and curettes, until I had a nice decompression of the nerve root and the midline. I then palpated with a coronary dilator along the nerve root and into the foramen to assure adequate decompression. I felt no more compression of the nerve root. I irrigated with saline solution containing bacitracin. Achieved hemostasis with bipolar cautery, lined the dura with Gelfoam, and then closed the fascia with 0 Vicryl. I closed the subcutaneous tissues with 2-0 Vicryl and the subcuticular tissues with 3-0 Vicryl. The skin was then closed with benzoin and Steri-Strips. The drapes were removed, a sterile dressing was applied.  My nurse practitioner was involved in the exposure, safe retraction of the neural elements, the disc work and the closure. the patient was awakened from general anesthesia and transferred to the recovery room in stable condition. At the end of the procedure all sponge, needle and instrument counts were correct.    PLAN OF CARE: Admit for overnight observation  PATIENT DISPOSITION:  PACU - hemodynamically stable.   Delay start of Pharmacological VTE agent (>24hrs) due to surgical blood loss or risk of bleeding:  yes

## 2022-10-02 NOTE — Anesthesia Procedure Notes (Signed)
Procedure Name: Intubation Date/Time: 10/02/2022 3:57 PM  Performed by: Loleta Harnoor Reta, CRNAPre-anesthesia Checklist: Patient identified, Patient being monitored, Timeout performed, Emergency Drugs available and Suction available Patient Re-evaluated:Patient Re-evaluated prior to induction Oxygen Delivery Method: Circle system utilized Preoxygenation: Pre-oxygenation with 100% oxygen Induction Type: IV induction Ventilation: Mask ventilation without difficulty Laryngoscope Size: 3 and Miller Grade View: Grade I Tube type: Oral Tube size: 7.0 mm Number of attempts: 1 Airway Equipment and Method: Stylet Placement Confirmation: ETT inserted through vocal cords under direct vision, positive ETCO2 and breath sounds checked- equal and bilateral Secured at: 21 cm Tube secured with: Tape Dental Injury: Teeth and Oropharynx as per pre-operative assessment

## 2022-10-02 NOTE — Transfer of Care (Signed)
Immediate Anesthesia Transfer of Care Note  Patient: [name]  Procedure(s) Performed: Re-do Microdiscectomy - Lumbar Four - Lumbar Five - right (Right: Back)  Patient Location: PACU  Anesthesia Type:General  Level of Consciousness: awake  Airway & Oxygen Therapy: Patient Spontanous Breathing  Post-op Assessment: Report given to RN and Post -op Vital signs reviewed and stable  Post vital signs: Reviewed and stable  Last Vitals:  Vitals Value Taken Time  BP 117/67 10/02/22 1745  Temp 36.5 C 10/02/22 1740  Pulse 80 10/02/22 1747  Resp 14 10/02/22 1747  SpO2 98 % 10/02/22 1747  Vitals shown include unvalidated device data.  Last Pain:  Vitals:   10/02/22 1740  TempSrc:   PainSc: 0-No pain      Patients Stated Pain Goal: 5 (10/02/22 1252)  Complications: No notable events documented.

## 2022-10-03 ENCOUNTER — Encounter (HOSPITAL_COMMUNITY): Payer: Self-pay | Admitting: Neurological Surgery

## 2022-10-06 NOTE — Anesthesia Postprocedure Evaluation (Signed)
Anesthesia Post Note  Patient: [name]  Procedure(s) Performed: Re-do Microdiscectomy - Lumbar Four - Lumbar Five - right (Right: Back)     Patient location during evaluation: PACU Anesthesia Type: General Level of consciousness: awake and alert Pain management: pain level controlled Vital Signs Assessment: post-procedure vital signs reviewed and stable Respiratory status: spontaneous breathing, nonlabored ventilation, respiratory function stable and patient connected to nasal cannula oxygen Cardiovascular status: blood pressure returned to baseline and stable Postop Assessment: no apparent nausea or vomiting Anesthetic complications: no   No notable events documented.  Last Vitals:  Vitals:   10/02/22 1805 10/02/22 1822  BP: 124/68 118/74  Pulse: 74 65  Resp: 12 18  Temp: 36.5 C   SpO2: 94% 99%    Last Pain:  Vitals:   10/02/22 1822  TempSrc:   PainSc: 0-No pain                 Kariann Wecker S

## 2022-10-20 ENCOUNTER — Ambulatory Visit (HOSPITAL_COMMUNITY): Payer: Medicare HMO

## 2022-10-20 ENCOUNTER — Encounter (HOSPITAL_COMMUNITY): Payer: Self-pay

## 2022-10-20 ENCOUNTER — Inpatient Hospital Stay (HOSPITAL_COMMUNITY): Admission: RE | Admit: 2022-10-20 | Payer: Medicare HMO | Source: Ambulatory Visit

## 2022-11-17 ENCOUNTER — Ambulatory Visit (HOSPITAL_COMMUNITY): Payer: Medicare HMO

## 2022-11-17 ENCOUNTER — Inpatient Hospital Stay (HOSPITAL_COMMUNITY): Admission: RE | Admit: 2022-11-17 | Payer: Medicare HMO | Source: Ambulatory Visit

## 2022-11-24 DIAGNOSIS — E785 Hyperlipidemia, unspecified: Secondary | ICD-10-CM | POA: Diagnosis not present

## 2022-11-24 DIAGNOSIS — E119 Type 2 diabetes mellitus without complications: Secondary | ICD-10-CM | POA: Diagnosis not present

## 2022-11-24 DIAGNOSIS — I10 Essential (primary) hypertension: Secondary | ICD-10-CM | POA: Diagnosis not present

## 2022-11-24 DIAGNOSIS — R002 Palpitations: Secondary | ICD-10-CM | POA: Diagnosis not present

## 2022-11-24 DIAGNOSIS — F411 Generalized anxiety disorder: Secondary | ICD-10-CM | POA: Diagnosis not present

## 2022-11-24 DIAGNOSIS — Z87891 Personal history of nicotine dependence: Secondary | ICD-10-CM | POA: Diagnosis not present

## 2022-11-24 DIAGNOSIS — J449 Chronic obstructive pulmonary disease, unspecified: Secondary | ICD-10-CM | POA: Diagnosis not present

## 2022-11-24 DIAGNOSIS — G8929 Other chronic pain: Secondary | ICD-10-CM | POA: Diagnosis not present

## 2022-11-24 DIAGNOSIS — M549 Dorsalgia, unspecified: Secondary | ICD-10-CM | POA: Diagnosis not present

## 2022-11-24 DIAGNOSIS — E860 Dehydration: Secondary | ICD-10-CM | POA: Diagnosis not present

## 2022-11-24 DIAGNOSIS — K219 Gastro-esophageal reflux disease without esophagitis: Secondary | ICD-10-CM | POA: Diagnosis not present

## 2022-11-24 DIAGNOSIS — G4700 Insomnia, unspecified: Secondary | ICD-10-CM | POA: Diagnosis not present

## 2022-12-17 ENCOUNTER — Ambulatory Visit (HOSPITAL_COMMUNITY)
Admission: RE | Admit: 2022-12-17 | Discharge: 2022-12-17 | Disposition: A | Discharge status: Home / Self Care | Payer: Medicare HMO | Source: Ambulatory Visit | Attending: Family Medicine | Admitting: Family Medicine

## 2022-12-17 ENCOUNTER — Encounter (HOSPITAL_COMMUNITY): Payer: Self-pay

## 2022-12-17 DIAGNOSIS — R92313 Mammographic fatty tissue density, bilateral breasts: Secondary | ICD-10-CM | POA: Diagnosis not present

## 2022-12-17 DIAGNOSIS — N6489 Other specified disorders of breast: Secondary | ICD-10-CM | POA: Insufficient documentation

## 2022-12-17 DIAGNOSIS — R928 Other abnormal and inconclusive findings on diagnostic imaging of breast: Secondary | ICD-10-CM | POA: Diagnosis not present

## 2022-12-29 DIAGNOSIS — I10 Essential (primary) hypertension: Secondary | ICD-10-CM | POA: Diagnosis not present

## 2022-12-29 DIAGNOSIS — E119 Type 2 diabetes mellitus without complications: Secondary | ICD-10-CM | POA: Diagnosis not present

## 2022-12-29 DIAGNOSIS — E785 Hyperlipidemia, unspecified: Secondary | ICD-10-CM | POA: Diagnosis not present

## 2023-01-04 ENCOUNTER — Other Ambulatory Visit (HOSPITAL_COMMUNITY): Payer: Self-pay | Admitting: Family Medicine

## 2023-01-04 DIAGNOSIS — M5126 Other intervertebral disc displacement, lumbar region: Secondary | ICD-10-CM | POA: Diagnosis not present

## 2023-01-04 DIAGNOSIS — G2581 Restless legs syndrome: Secondary | ICD-10-CM | POA: Diagnosis not present

## 2023-01-04 DIAGNOSIS — N939 Abnormal uterine and vaginal bleeding, unspecified: Secondary | ICD-10-CM

## 2023-01-04 DIAGNOSIS — J45909 Unspecified asthma, uncomplicated: Secondary | ICD-10-CM | POA: Diagnosis not present

## 2023-01-04 DIAGNOSIS — E119 Type 2 diabetes mellitus without complications: Secondary | ICD-10-CM | POA: Diagnosis not present

## 2023-01-04 DIAGNOSIS — Z0001 Encounter for general adult medical examination with abnormal findings: Secondary | ICD-10-CM | POA: Diagnosis not present

## 2023-01-04 DIAGNOSIS — B372 Candidiasis of skin and nail: Secondary | ICD-10-CM | POA: Diagnosis not present

## 2023-01-04 DIAGNOSIS — G894 Chronic pain syndrome: Secondary | ICD-10-CM | POA: Diagnosis not present

## 2023-01-04 DIAGNOSIS — Z0000 Encounter for general adult medical examination without abnormal findings: Secondary | ICD-10-CM | POA: Diagnosis not present

## 2023-01-04 DIAGNOSIS — N189 Chronic kidney disease, unspecified: Secondary | ICD-10-CM | POA: Diagnosis not present

## 2023-01-04 DIAGNOSIS — I10 Essential (primary) hypertension: Secondary | ICD-10-CM | POA: Diagnosis not present

## 2023-01-04 DIAGNOSIS — F411 Generalized anxiety disorder: Secondary | ICD-10-CM | POA: Diagnosis not present

## 2023-01-13 ENCOUNTER — Ambulatory Visit (HOSPITAL_COMMUNITY): Payer: Medicare HMO

## 2023-01-13 ENCOUNTER — Encounter (HOSPITAL_COMMUNITY): Payer: Self-pay

## 2023-01-18 DIAGNOSIS — K047 Periapical abscess without sinus: Secondary | ICD-10-CM | POA: Diagnosis not present

## 2023-01-18 DIAGNOSIS — R110 Nausea: Secondary | ICD-10-CM | POA: Diagnosis not present

## 2023-01-18 DIAGNOSIS — E119 Type 2 diabetes mellitus without complications: Secondary | ICD-10-CM | POA: Diagnosis not present

## 2023-01-30 IMAGING — MG MM DIGITAL DIAGNOSTIC UNILAT*R* W/ TOMO W/ CAD
6 series · 6 of 18 positions shown · non-contrast
Comparison: Previous exam(s).

CLINICAL DATA: 59-year-old female recalled from baseline screening
mammogram for a possible right breast mass and asymmetry.

EXAM:
DIGITAL DIAGNOSTIC UNILATERAL RIGHT MAMMOGRAM WITH TOMOSYNTHESIS AND
CAD; ULTRASOUND RIGHT BREAST LIMITED
TECHNIQUE: Right digital diagnostic mammography and breast tomosynthesis was
performed. The images were evaluated with computer-aided detection.;
Targeted ultrasound examination of the right breast was performed

[R CC synth-2D (1 of 2)]
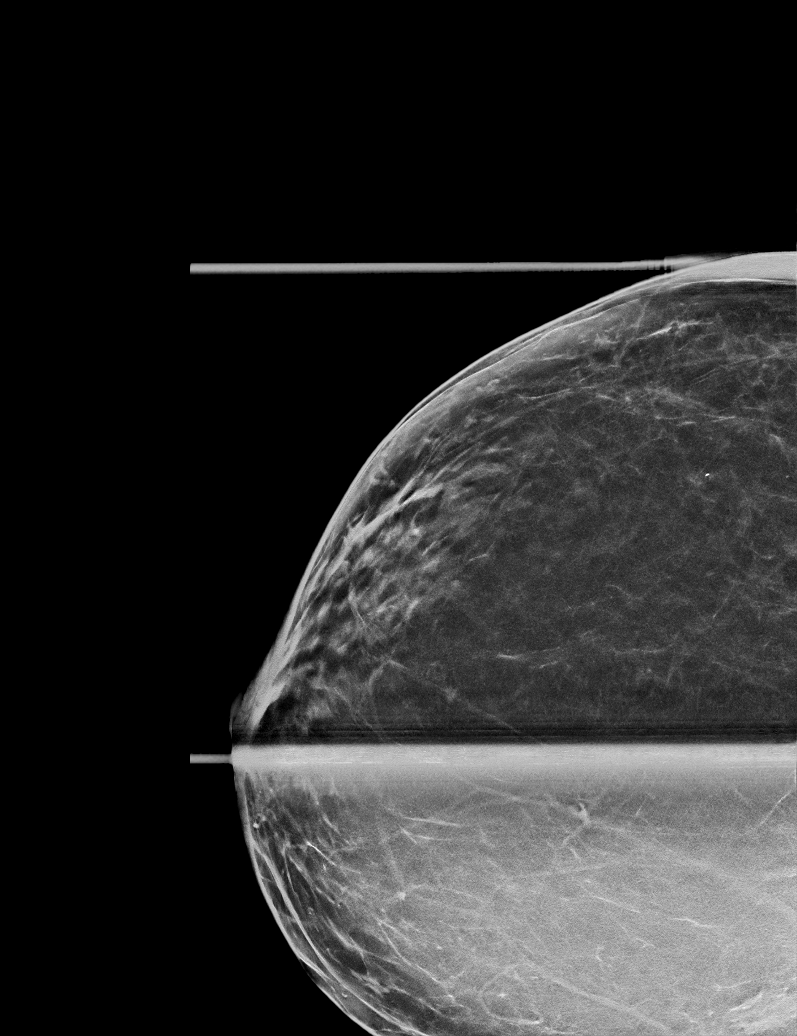

[R MLO synth-2D]
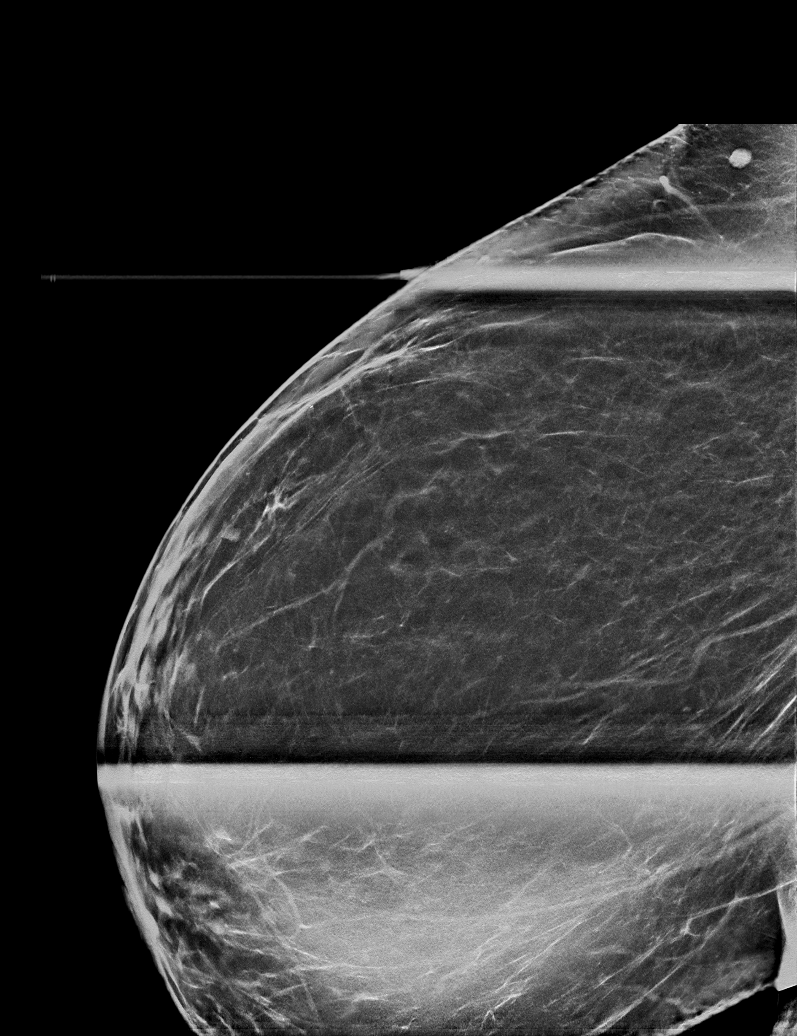

[R CC synth-2D (2 of 2)]
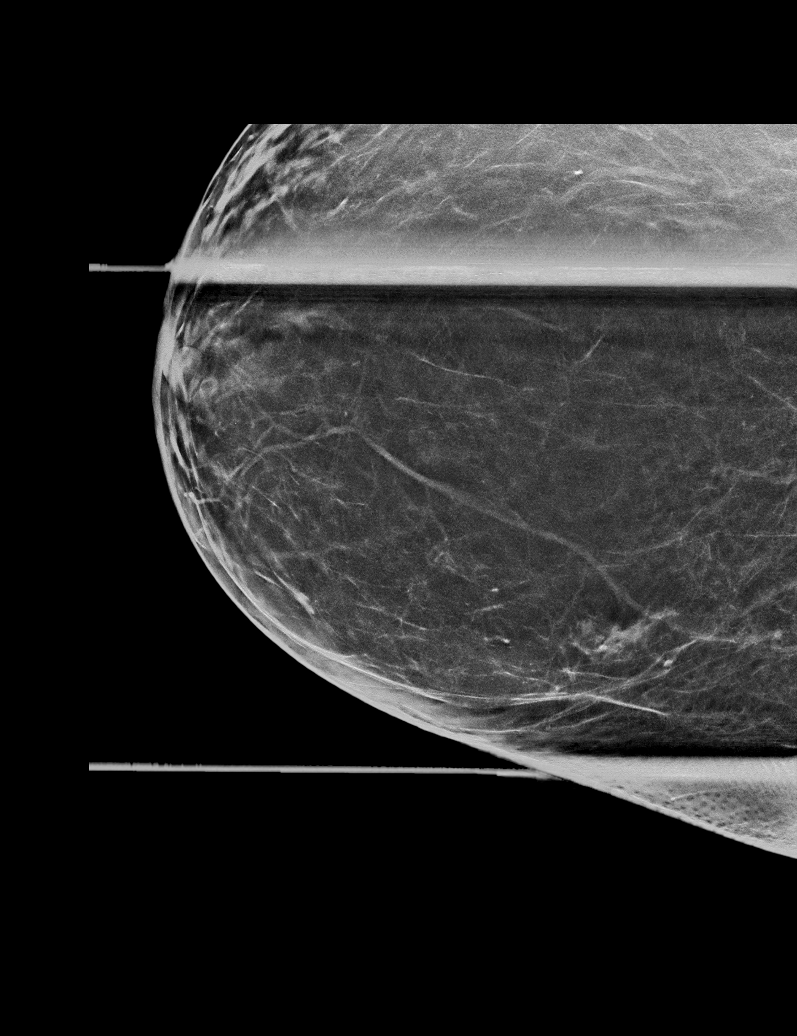

[R MLO tomo · tomo slice 35/68.0]
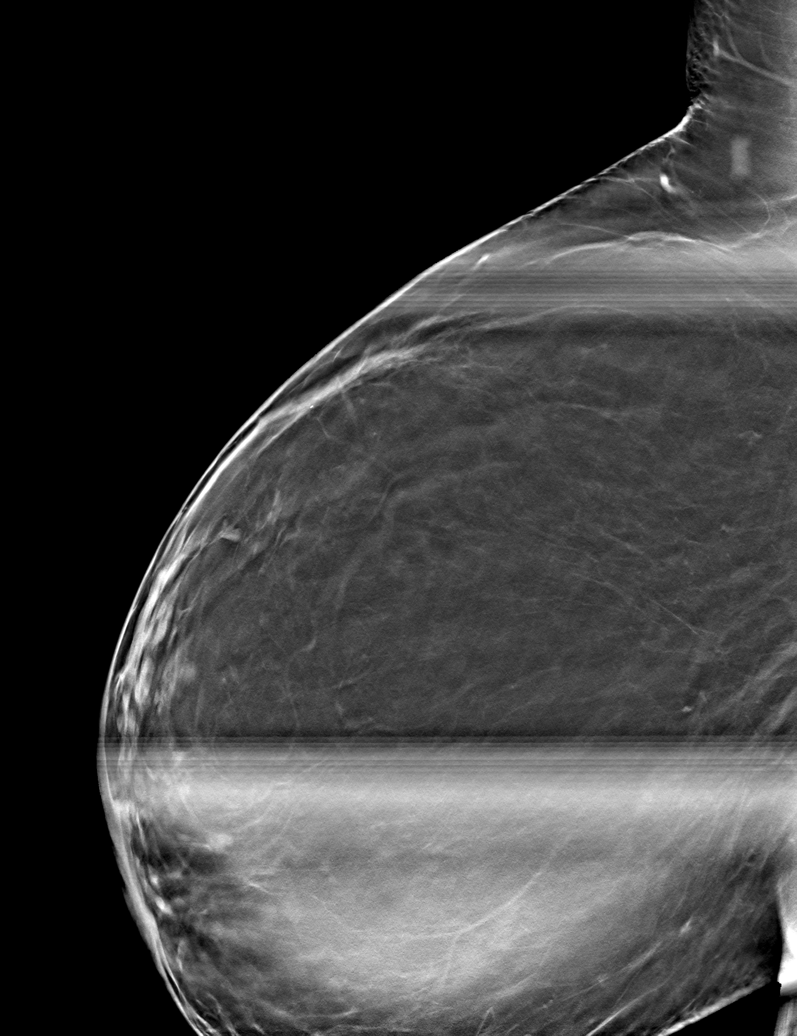

[R CC tomo (1 of 2) · tomo slice 28/55.0]
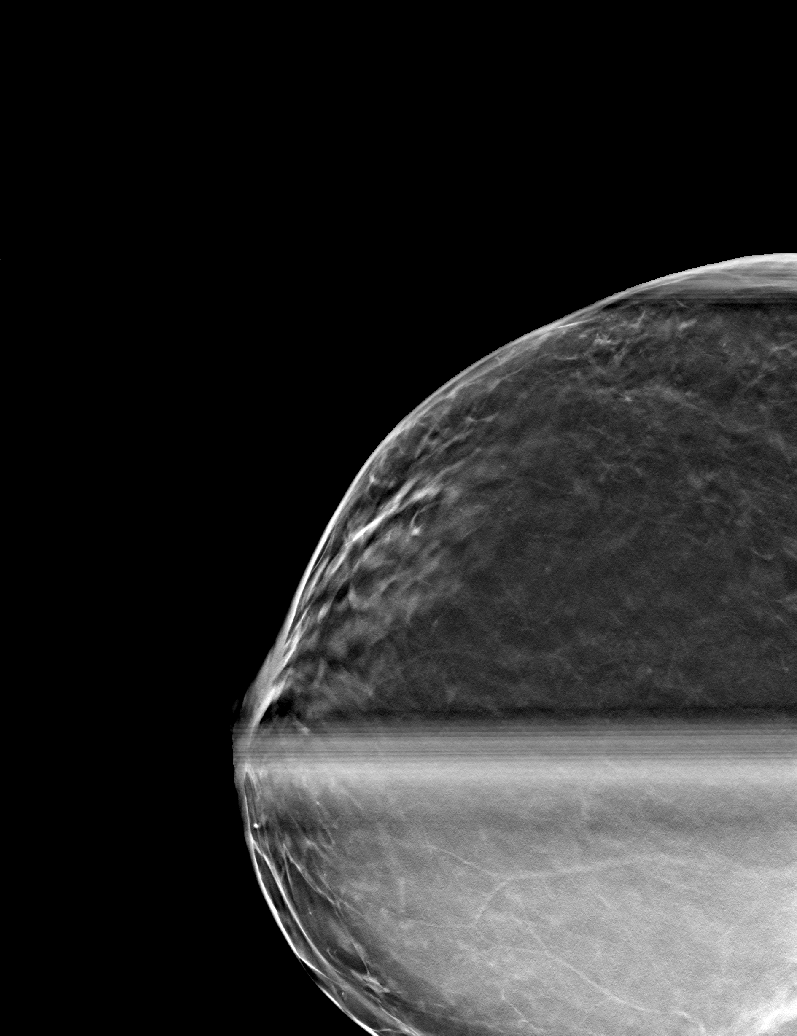

[R CC tomo (2 of 2) · tomo slice 33/64.0]
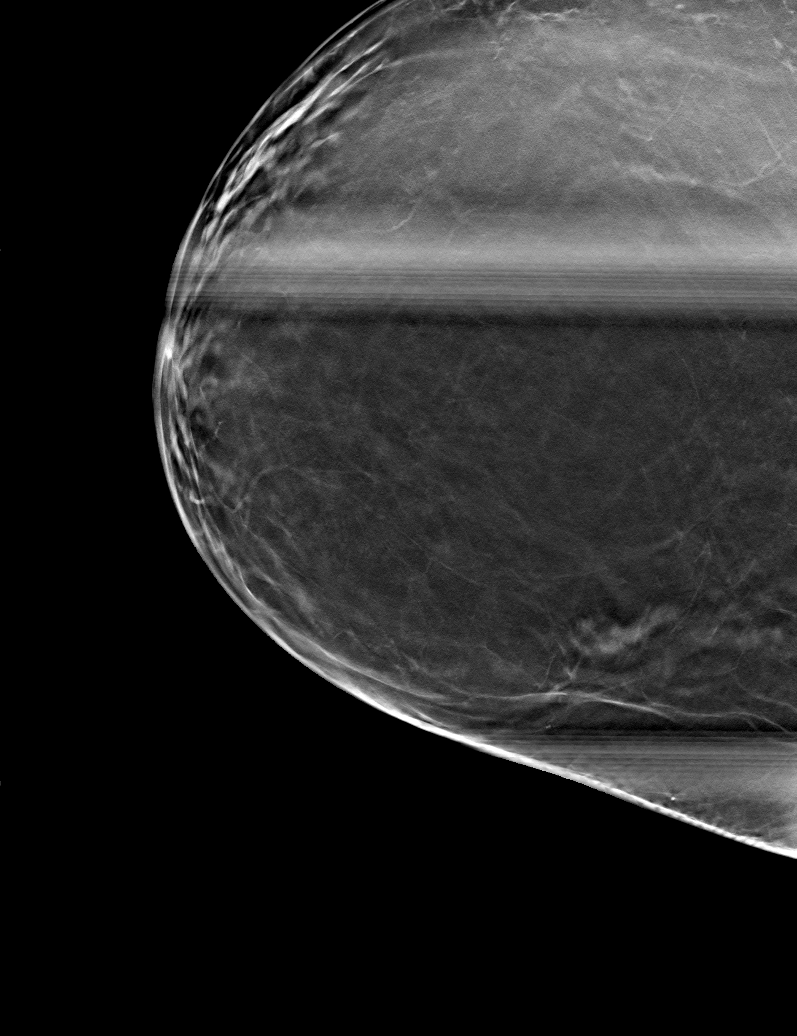

[6 of 18 positions shown; findings below may reference images not displayed]

ACR Breast Density Category b: There are scattered areas of
fibroglandular density.
FINDINGS: Previously described, possible mass in the upper-outer quadrant of
the right breast does not persist on today's additional views. An
asymmetry in the far medial right breast persists but has the
appearance of a focal island of fibroglandular tissue. Further
evaluation with ultrasound was performed.

Targeted ultrasound is performed, showing unremarkable
fibroglandular tissue without focal or suspicious sonographic
abnormality. Evaluation of the entire upper inner quadrant of the
right breast was performed.
IMPRESSION: Probably benign medial right breast asymmetry without ultrasound
correlate recommendation is for short-term imaging follow-up.

RECOMMENDATION:
Diagnostic right breast mammogram and possible ultrasound in 6
months.

I have discussed the findings and recommendations with the patient.
If applicable, a reminder letter will be sent to the patient
regarding the next appointment.

BI-RADS CATEGORY  3: Probably benign.

## 2023-04-14 DIAGNOSIS — E785 Hyperlipidemia, unspecified: Secondary | ICD-10-CM | POA: Diagnosis not present

## 2023-04-14 DIAGNOSIS — I10 Essential (primary) hypertension: Secondary | ICD-10-CM | POA: Diagnosis not present

## 2023-04-14 DIAGNOSIS — E119 Type 2 diabetes mellitus without complications: Secondary | ICD-10-CM | POA: Diagnosis not present

## 2023-04-15 DIAGNOSIS — I10 Essential (primary) hypertension: Secondary | ICD-10-CM | POA: Diagnosis not present

## 2023-04-15 DIAGNOSIS — E785 Hyperlipidemia, unspecified: Secondary | ICD-10-CM | POA: Diagnosis not present

## 2023-04-15 DIAGNOSIS — E119 Type 2 diabetes mellitus without complications: Secondary | ICD-10-CM | POA: Diagnosis not present

## 2023-05-12 DIAGNOSIS — Z79899 Other long term (current) drug therapy: Secondary | ICD-10-CM | POA: Diagnosis not present

## 2023-05-12 DIAGNOSIS — I10 Essential (primary) hypertension: Secondary | ICD-10-CM | POA: Diagnosis not present

## 2023-05-12 DIAGNOSIS — B372 Candidiasis of skin and nail: Secondary | ICD-10-CM | POA: Diagnosis not present

## 2023-05-12 DIAGNOSIS — E782 Mixed hyperlipidemia: Secondary | ICD-10-CM | POA: Diagnosis not present

## 2023-05-12 DIAGNOSIS — J45909 Unspecified asthma, uncomplicated: Secondary | ICD-10-CM | POA: Diagnosis not present

## 2023-05-12 DIAGNOSIS — N189 Chronic kidney disease, unspecified: Secondary | ICD-10-CM | POA: Diagnosis not present

## 2023-05-12 DIAGNOSIS — E1122 Type 2 diabetes mellitus with diabetic chronic kidney disease: Secondary | ICD-10-CM | POA: Diagnosis not present

## 2023-05-12 DIAGNOSIS — M5126 Other intervertebral disc displacement, lumbar region: Secondary | ICD-10-CM | POA: Diagnosis not present

## 2023-05-12 DIAGNOSIS — I129 Hypertensive chronic kidney disease with stage 1 through stage 4 chronic kidney disease, or unspecified chronic kidney disease: Secondary | ICD-10-CM | POA: Diagnosis not present

## 2023-05-12 DIAGNOSIS — G894 Chronic pain syndrome: Secondary | ICD-10-CM | POA: Diagnosis not present

## 2023-05-12 DIAGNOSIS — F411 Generalized anxiety disorder: Secondary | ICD-10-CM | POA: Diagnosis not present

## 2023-05-12 DIAGNOSIS — Z23 Encounter for immunization: Secondary | ICD-10-CM | POA: Diagnosis not present

## 2023-05-24 ENCOUNTER — Other Ambulatory Visit (HOSPITAL_COMMUNITY): Payer: Self-pay | Admitting: Internal Medicine

## 2023-05-24 DIAGNOSIS — Z1231 Encounter for screening mammogram for malignant neoplasm of breast: Secondary | ICD-10-CM

## 2023-05-26 ENCOUNTER — Inpatient Hospital Stay (HOSPITAL_COMMUNITY): Admission: RE | Admit: 2023-05-26 | Payer: Medicare HMO | Source: Ambulatory Visit

## 2023-08-17 DIAGNOSIS — E1165 Type 2 diabetes mellitus with hyperglycemia: Secondary | ICD-10-CM | POA: Diagnosis not present

## 2023-08-17 DIAGNOSIS — I10 Essential (primary) hypertension: Secondary | ICD-10-CM | POA: Diagnosis not present

## 2023-08-20 IMAGING — DX DG FOOT COMPLETE 3+V*L*
3 series · 3 of 3 positions shown · non-contrast
Comparison: None.

CLINICAL DATA: Insect bite, left foot swelling

EXAM:
LEFT FOOT - COMPLETE 3+ VIEW

[foot ap]
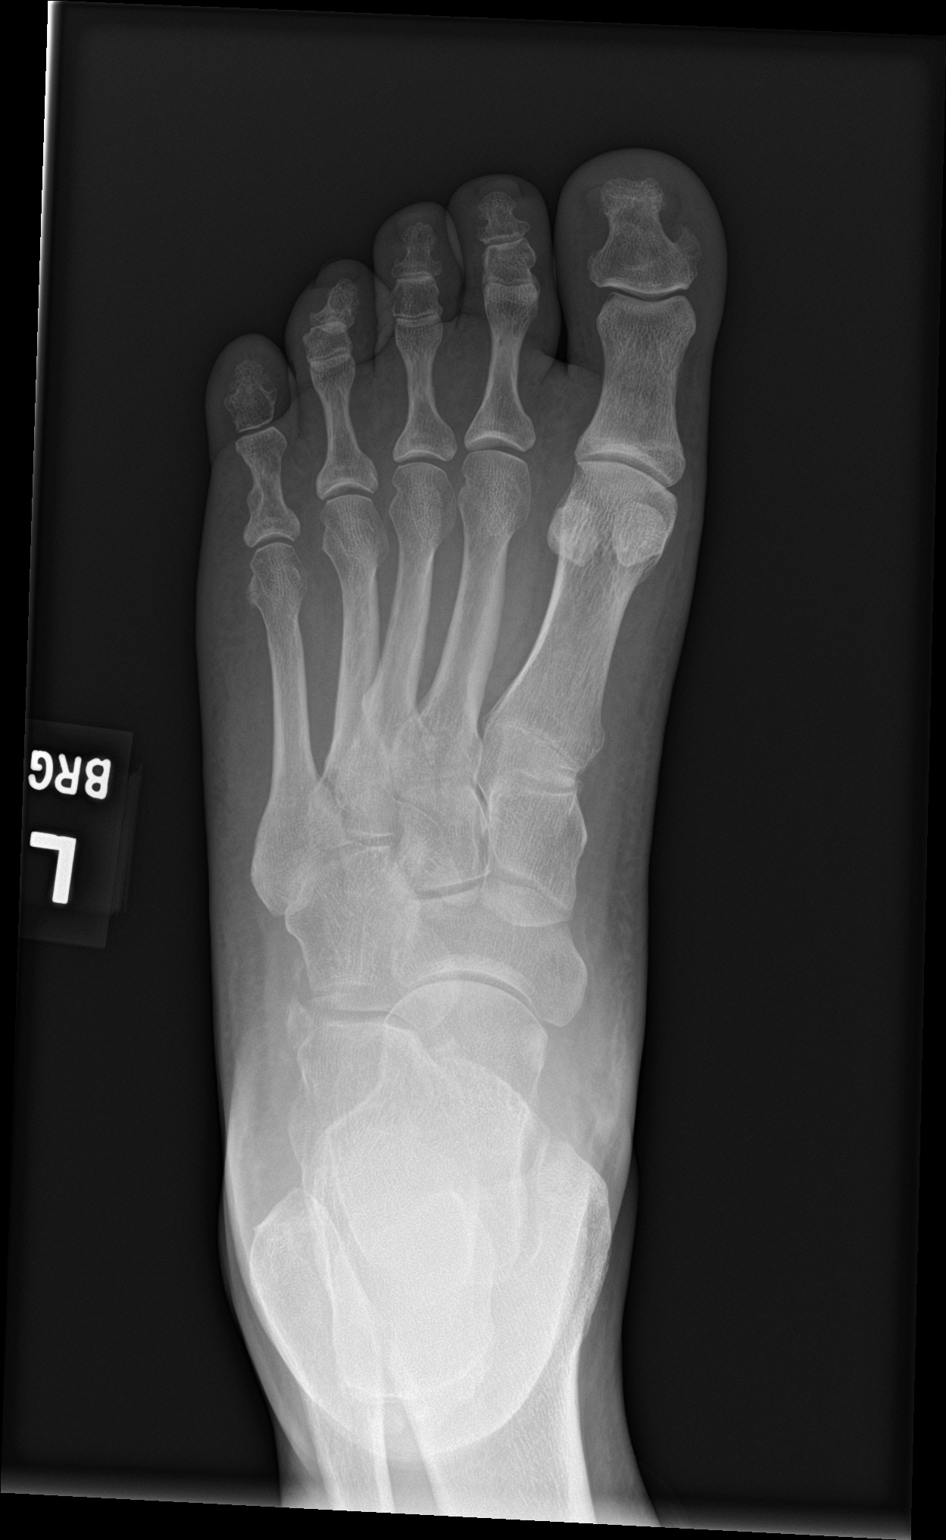

[foot obl]
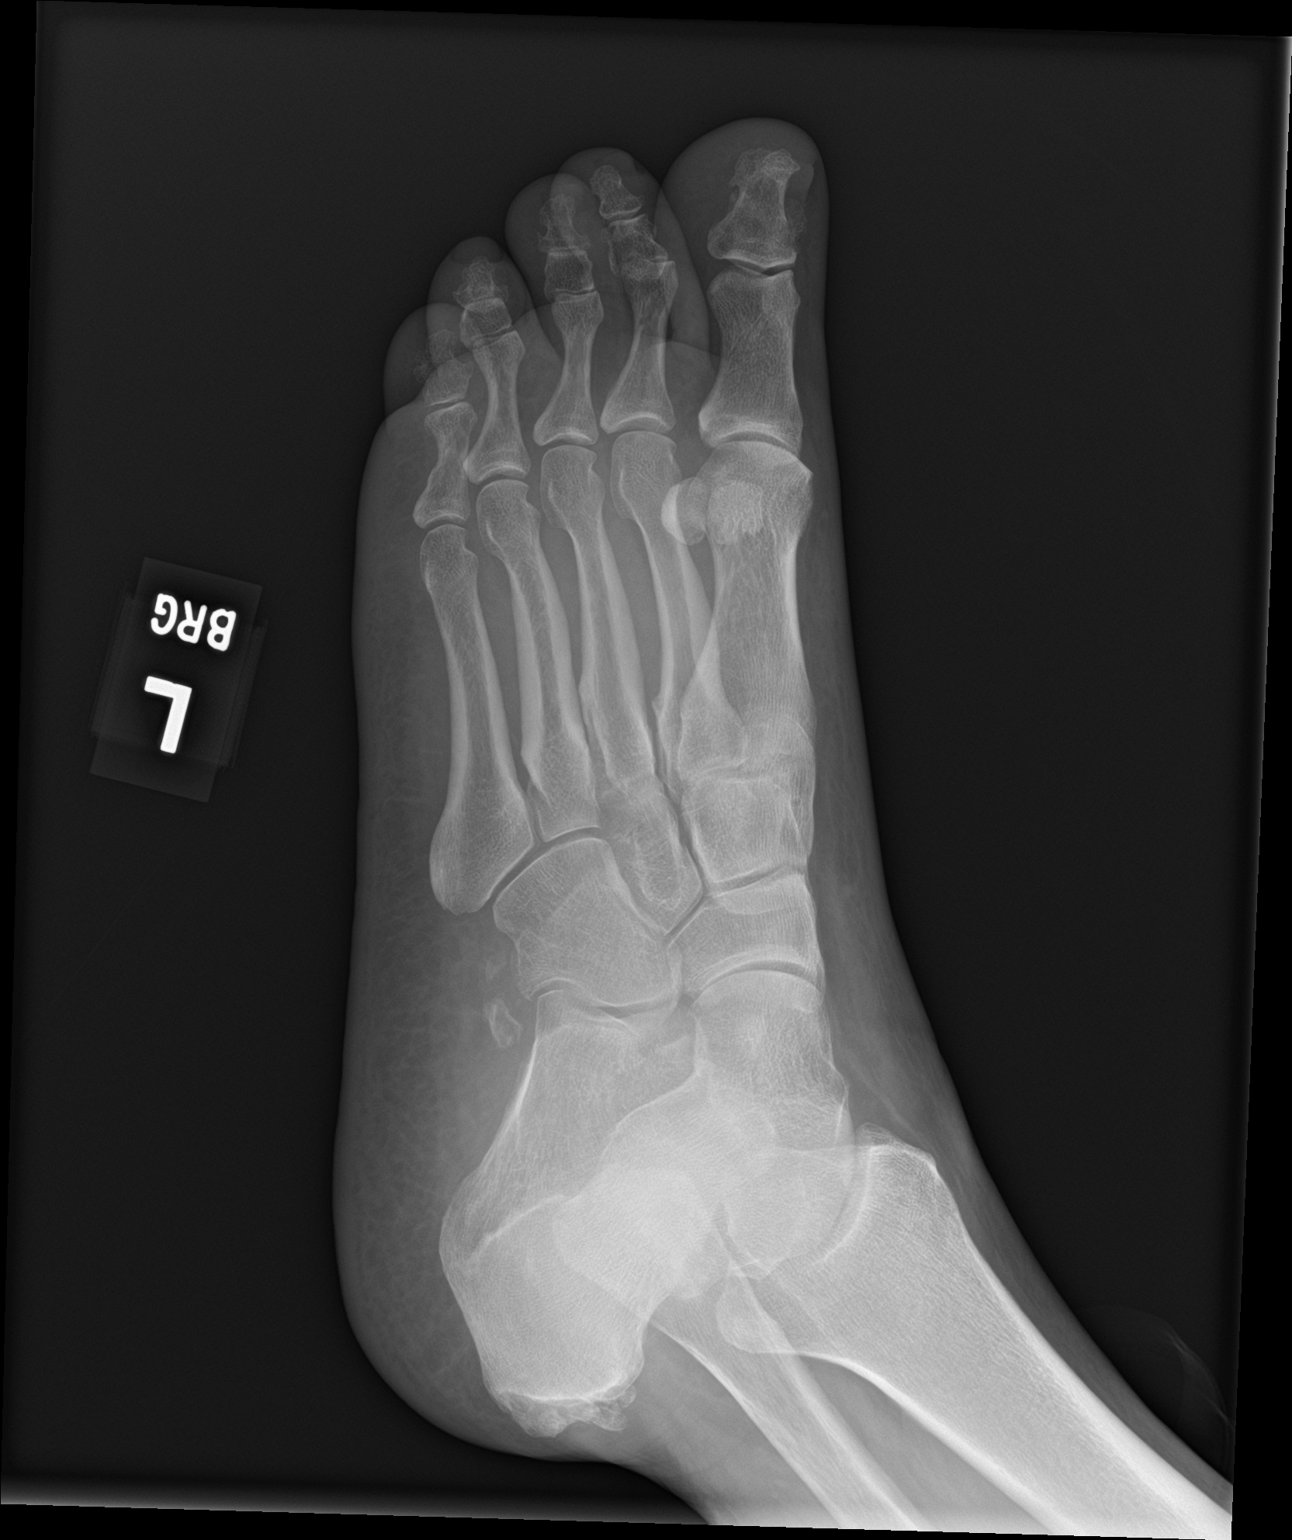

[foot lat]
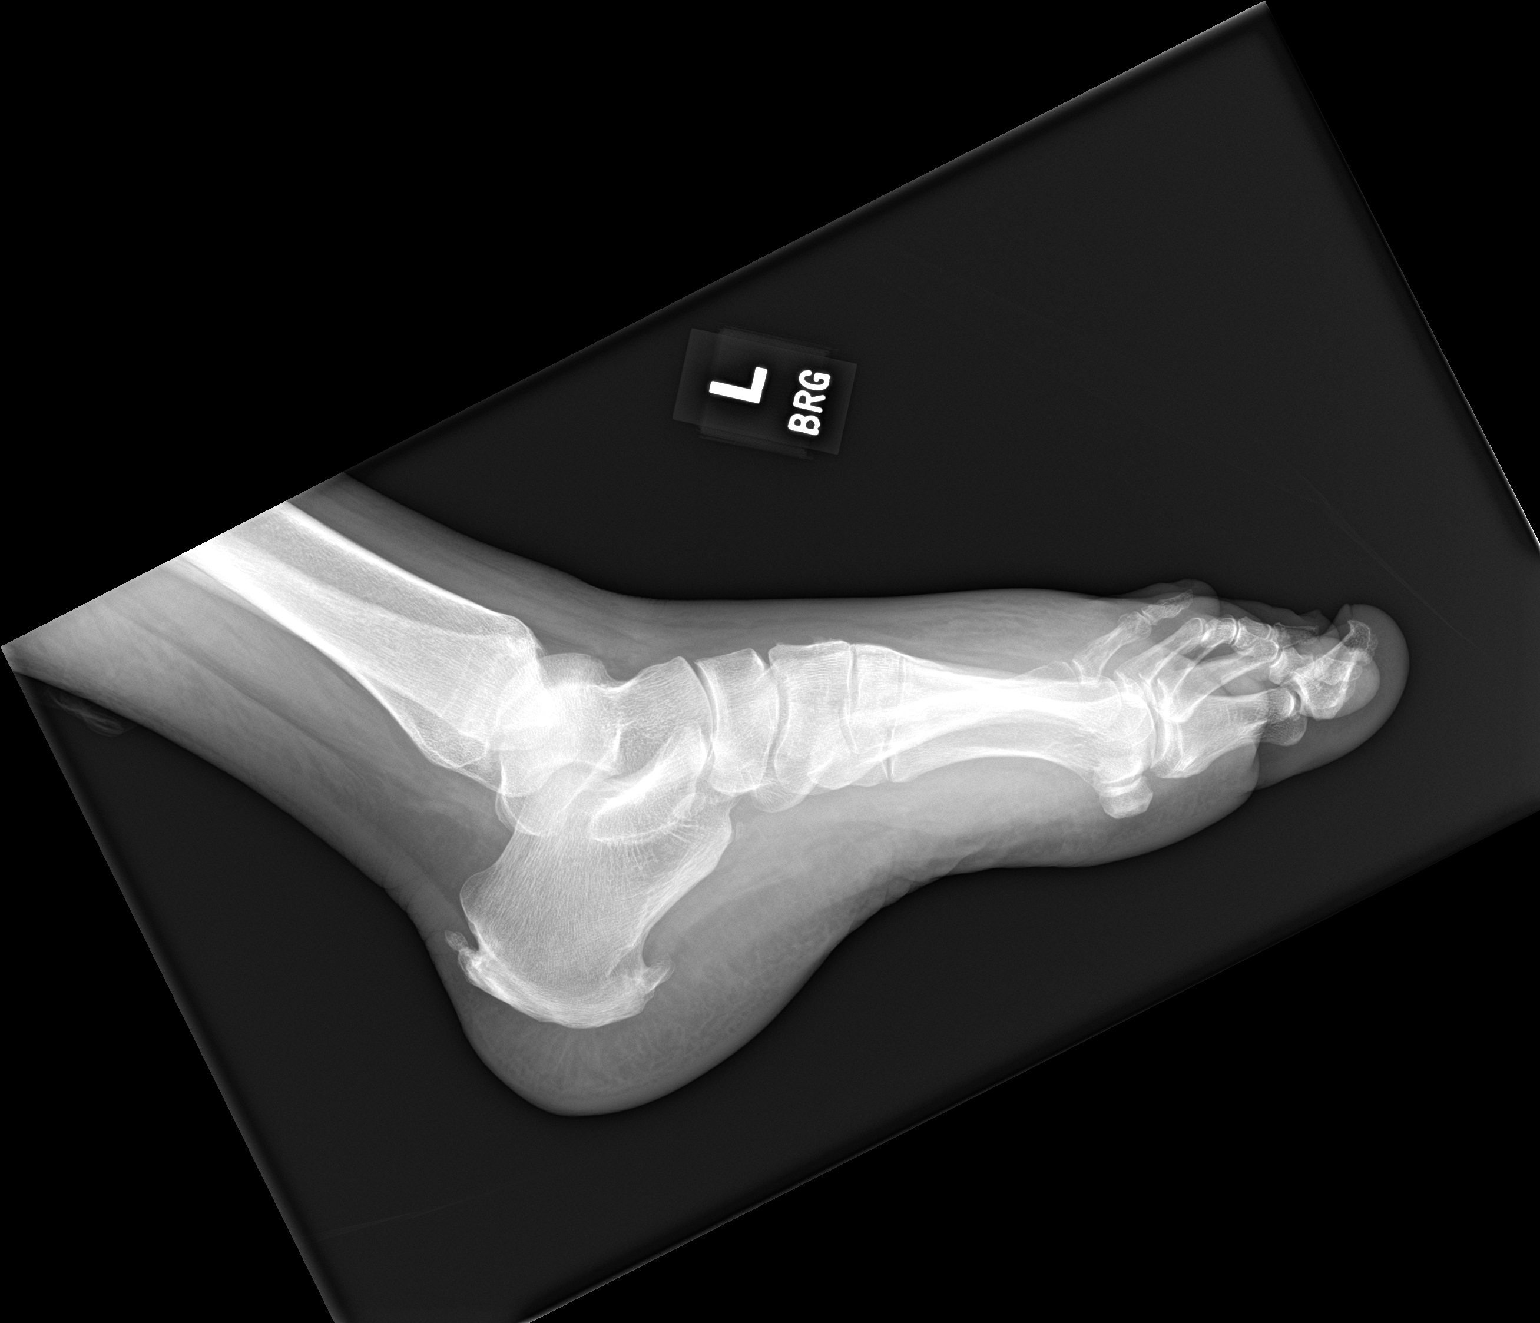

[3 of 3 positions shown; findings below may reference images not displayed]

FINDINGS: Normal alignment. No fracture or dislocation. Moderate plantar and
superior calcaneal spurs. Joint spaces are preserved. Moderate
dorsal soft tissue swelling of the left forefoot noted.
IMPRESSION: Forefoot soft tissue swelling.  No fracture or dislocation.

## 2023-09-17 DIAGNOSIS — B372 Candidiasis of skin and nail: Secondary | ICD-10-CM | POA: Diagnosis not present

## 2023-09-17 DIAGNOSIS — E782 Mixed hyperlipidemia: Secondary | ICD-10-CM | POA: Diagnosis not present

## 2023-09-17 DIAGNOSIS — E1122 Type 2 diabetes mellitus with diabetic chronic kidney disease: Secondary | ICD-10-CM | POA: Diagnosis not present

## 2023-09-17 DIAGNOSIS — G894 Chronic pain syndrome: Secondary | ICD-10-CM | POA: Diagnosis not present

## 2023-09-17 DIAGNOSIS — N189 Chronic kidney disease, unspecified: Secondary | ICD-10-CM | POA: Diagnosis not present

## 2023-09-17 DIAGNOSIS — I129 Hypertensive chronic kidney disease with stage 1 through stage 4 chronic kidney disease, or unspecified chronic kidney disease: Secondary | ICD-10-CM | POA: Diagnosis not present

## 2023-09-17 DIAGNOSIS — F411 Generalized anxiety disorder: Secondary | ICD-10-CM | POA: Diagnosis not present

## 2023-09-17 DIAGNOSIS — Z01419 Encounter for gynecological examination (general) (routine) without abnormal findings: Secondary | ICD-10-CM | POA: Diagnosis not present

## 2023-09-17 DIAGNOSIS — I10 Essential (primary) hypertension: Secondary | ICD-10-CM | POA: Diagnosis not present

## 2023-09-17 DIAGNOSIS — J45909 Unspecified asthma, uncomplicated: Secondary | ICD-10-CM | POA: Diagnosis not present

## 2023-09-17 DIAGNOSIS — Z23 Encounter for immunization: Secondary | ICD-10-CM | POA: Diagnosis not present

## 2023-09-17 DIAGNOSIS — Z0000 Encounter for general adult medical examination without abnormal findings: Secondary | ICD-10-CM | POA: Diagnosis not present

## 2023-11-09 ENCOUNTER — Telehealth: Payer: Self-pay

## 2023-11-09 NOTE — Progress Notes (Signed)
   11/09/2023  Patient ID: [name], female   DOB: December 14, 1960, 63 y.o.   MRN: [id number]  Adherence Monitoring:  Up to date on all meds, documentation in [facility].   Flint Hummer, PharmD

## 2023-12-06 ENCOUNTER — Telehealth: Payer: Self-pay

## 2023-12-06 NOTE — Telephone Encounter (Signed)
 Up to date on meds, next review in July

## 2024-01-05 ENCOUNTER — Telehealth: Payer: Self-pay

## 2024-01-05 NOTE — Telephone Encounter (Signed)
 Up to date on meds, next review in August

## 2024-02-01 DIAGNOSIS — I10 Essential (primary) hypertension: Secondary | ICD-10-CM | POA: Diagnosis not present

## 2024-02-01 DIAGNOSIS — E119 Type 2 diabetes mellitus without complications: Secondary | ICD-10-CM | POA: Diagnosis not present

## 2024-02-02 DIAGNOSIS — I10 Essential (primary) hypertension: Secondary | ICD-10-CM | POA: Diagnosis not present

## 2024-02-07 DIAGNOSIS — G894 Chronic pain syndrome: Secondary | ICD-10-CM | POA: Diagnosis not present

## 2024-02-07 DIAGNOSIS — N189 Chronic kidney disease, unspecified: Secondary | ICD-10-CM | POA: Diagnosis not present

## 2024-02-07 DIAGNOSIS — E782 Mixed hyperlipidemia: Secondary | ICD-10-CM | POA: Diagnosis not present

## 2024-02-07 DIAGNOSIS — E119 Type 2 diabetes mellitus without complications: Secondary | ICD-10-CM | POA: Diagnosis not present

## 2024-02-07 DIAGNOSIS — I10 Essential (primary) hypertension: Secondary | ICD-10-CM | POA: Diagnosis not present

## 2024-02-07 DIAGNOSIS — E1122 Type 2 diabetes mellitus with diabetic chronic kidney disease: Secondary | ICD-10-CM | POA: Diagnosis not present

## 2024-02-07 DIAGNOSIS — I129 Hypertensive chronic kidney disease with stage 1 through stage 4 chronic kidney disease, or unspecified chronic kidney disease: Secondary | ICD-10-CM | POA: Diagnosis not present

## 2024-02-07 DIAGNOSIS — Z87891 Personal history of nicotine dependence: Secondary | ICD-10-CM | POA: Diagnosis not present

## 2024-02-07 DIAGNOSIS — F411 Generalized anxiety disorder: Secondary | ICD-10-CM | POA: Diagnosis not present

## 2024-02-07 DIAGNOSIS — J45909 Unspecified asthma, uncomplicated: Secondary | ICD-10-CM | POA: Diagnosis not present

## 2024-05-17 DIAGNOSIS — I10 Essential (primary) hypertension: Secondary | ICD-10-CM | POA: Diagnosis not present

## 2024-05-17 DIAGNOSIS — E119 Type 2 diabetes mellitus without complications: Secondary | ICD-10-CM | POA: Diagnosis not present

## 2024-05-25 ENCOUNTER — Other Ambulatory Visit (HOSPITAL_COMMUNITY): Payer: Self-pay | Admitting: Internal Medicine

## 2024-05-25 DIAGNOSIS — Z1231 Encounter for screening mammogram for malignant neoplasm of breast: Secondary | ICD-10-CM

## 2024-05-31 ENCOUNTER — Ambulatory Visit (HOSPITAL_COMMUNITY)

## 2024-07-04 ENCOUNTER — Encounter: Payer: Self-pay | Admitting: *Deleted

## 2024-07-04 NOTE — Progress Notes (Signed)
 [name]                                          MRN: [id number]   07/04/2024   The [facility] Quality Team Specialist reviewed this patient medical record for the purposes of chart review for care gap closure. The following were reviewed: chart review for care gap closure-controlling blood pressure.    [facility] Quality Team

## 2024-07-27 ENCOUNTER — Encounter: Payer: Self-pay | Admitting: *Deleted

## 2024-07-27 NOTE — Progress Notes (Signed)
 [name]                                          MRN: [id number]   07/27/2024   The [facility] Quality Team Specialist reviewed this patient medical record for the purposes of chart review for care gap closure. The following were reviewed: chart review for care gap closure-diabetic eye exam.    [facility] Quality Team
